# Patient Record
Sex: Male | Born: 1955 | Race: White | Smoking: Never smoker
Health system: Southern US, Community
[De-identification: ages and names within clinical notes are randomized; demographics above are authoritative.]

## PROBLEM LIST (undated history)

## (undated) DIAGNOSIS — K219 Gastro-esophageal reflux disease without esophagitis: Secondary | ICD-10-CM

## (undated) DIAGNOSIS — J45909 Unspecified asthma, uncomplicated: Secondary | ICD-10-CM

## (undated) DIAGNOSIS — T7840XA Allergy, unspecified, initial encounter: Secondary | ICD-10-CM

## (undated) DIAGNOSIS — M25519 Pain in unspecified shoulder: Secondary | ICD-10-CM

## (undated) DIAGNOSIS — M549 Dorsalgia, unspecified: Secondary | ICD-10-CM

## (undated) DIAGNOSIS — J449 Chronic obstructive pulmonary disease, unspecified: Secondary | ICD-10-CM

## (undated) DIAGNOSIS — IMO0002 Reserved for concepts with insufficient information to code with codable children: Secondary | ICD-10-CM

## (undated) DIAGNOSIS — F419 Anxiety disorder, unspecified: Secondary | ICD-10-CM

## (undated) HISTORY — DX: Pain in unspecified shoulder: M25.519

## (undated) HISTORY — DX: Gastro-esophageal reflux disease without esophagitis: K21.9

## (undated) HISTORY — DX: Chronic obstructive pulmonary disease, unspecified: J44.9

## (undated) HISTORY — DX: Allergy, unspecified, initial encounter: T78.40XA

## (undated) HISTORY — DX: Reserved for concepts with insufficient information to code with codable children: IMO0002

## (undated) HISTORY — DX: Unspecified asthma, uncomplicated: J45.909

## (undated) HISTORY — DX: Anxiety disorder, unspecified: F41.9

## (undated) HISTORY — PX: EYE SURGERY: SHX253

## (undated) HISTORY — PX: TONSILECTOMY/ADENOIDECTOMY WITH MYRINGOTOMY: SHX6125

## (undated) HISTORY — DX: Dorsalgia, unspecified: M54.9

---

## 2019-11-10 ENCOUNTER — Ambulatory Visit: Payer: Self-pay | Admitting: Family Medicine

## 2019-12-04 ENCOUNTER — Ambulatory Visit
Admission: RE | Admit: 2019-12-04 | Discharge: 2019-12-04 | Disposition: A | Discharge status: Home / Self Care | Payer: BC Managed Care – PPO | Source: Ambulatory Visit | Attending: Occupational Medicine | Admitting: Occupational Medicine

## 2019-12-04 ENCOUNTER — Other Ambulatory Visit: Payer: Self-pay | Admitting: Occupational Medicine

## 2019-12-04 ENCOUNTER — Other Ambulatory Visit: Payer: Self-pay

## 2019-12-04 DIAGNOSIS — M47816 Spondylosis without myelopathy or radiculopathy, lumbar region: Secondary | ICD-10-CM

## 2019-12-04 DIAGNOSIS — M25512 Pain in left shoulder: Secondary | ICD-10-CM | POA: Diagnosis not present

## 2019-12-04 DIAGNOSIS — M542 Cervicalgia: Secondary | ICD-10-CM

## 2019-12-04 DIAGNOSIS — M5136 Other intervertebral disc degeneration, lumbar region: Secondary | ICD-10-CM | POA: Diagnosis not present

## 2019-12-04 DIAGNOSIS — M19012 Primary osteoarthritis, left shoulder: Secondary | ICD-10-CM | POA: Diagnosis not present

## 2019-12-04 DIAGNOSIS — M47812 Spondylosis without myelopathy or radiculopathy, cervical region: Secondary | ICD-10-CM

## 2019-12-08 ENCOUNTER — Ambulatory Visit: Payer: BC Managed Care – PPO | Attending: Internal Medicine

## 2019-12-08 ENCOUNTER — Other Ambulatory Visit: Payer: Self-pay

## 2019-12-08 DIAGNOSIS — Z20822 Contact with and (suspected) exposure to covid-19: Secondary | ICD-10-CM | POA: Diagnosis not present

## 2019-12-09 LAB — NOVEL CORONAVIRUS, NAA: SARS-CoV-2, NAA: NOT DETECTED

## 2020-02-10 ENCOUNTER — Ambulatory Visit (INDEPENDENT_AMBULATORY_CARE_PROVIDER_SITE_OTHER): Payer: BC Managed Care – PPO | Admitting: Internal Medicine

## 2020-02-10 ENCOUNTER — Encounter (INDEPENDENT_AMBULATORY_CARE_PROVIDER_SITE_OTHER): Payer: Self-pay | Admitting: Internal Medicine

## 2020-02-10 ENCOUNTER — Other Ambulatory Visit: Payer: Self-pay

## 2020-02-10 VITALS — BP 140/100 | HR 87 | Temp 98.3°F | Ht 67.0 in | Wt 181.4 lb

## 2020-02-10 DIAGNOSIS — Z125 Encounter for screening for malignant neoplasm of prostate: Secondary | ICD-10-CM | POA: Diagnosis not present

## 2020-02-10 DIAGNOSIS — R5381 Other malaise: Secondary | ICD-10-CM | POA: Diagnosis not present

## 2020-02-10 DIAGNOSIS — I10 Essential (primary) hypertension: Secondary | ICD-10-CM | POA: Diagnosis not present

## 2020-02-10 DIAGNOSIS — Z131 Encounter for screening for diabetes mellitus: Secondary | ICD-10-CM

## 2020-02-10 DIAGNOSIS — Z13220 Encounter for screening for lipoid disorders: Secondary | ICD-10-CM | POA: Diagnosis not present

## 2020-02-10 DIAGNOSIS — E559 Vitamin D deficiency, unspecified: Secondary | ICD-10-CM

## 2020-02-10 DIAGNOSIS — F419 Anxiety disorder, unspecified: Secondary | ICD-10-CM

## 2020-02-10 DIAGNOSIS — R5383 Other fatigue: Secondary | ICD-10-CM | POA: Diagnosis not present

## 2020-02-10 MED ORDER — AMLODIPINE BESYLATE 5 MG PO TABS: 5.0000 mg | ORAL_TABLET | Freq: Every day | ORAL | 3 refills | Status: DC

## 2020-02-10 NOTE — Progress Notes (Signed)
Metrics: Intervention Frequency ACO  Documented Smoking Status Yearly  Screened one or more times in 24 months  Cessation Counseling or  Active cessation medication Past 24 months  Past 24 months   Guideline developer: UpToDate (See UpToDate for funding source) Date Released: 2014       Wellness Office Visit  Subjective:  Patient ID: Martin Rodriguez, male    DOB: May 07, 1956  Age: 64 y.o. MRN: [id number]  CC: This 64 year old man comes to our practice as a new patient to [place] establish care. HPI  He has a history of hypertension.  He takes lisinopril for his hypertension has noticed that his blood pressure readings have been elevated at home.  He moved from [place] in October 2020 and lives locally.  He denies any chest pain, dyspnea, palpitations or limb weakness.  He has no history of coronary artery disease or cerebrovascular disease. He has a history of anxiety and takes citalopram which he would like to discontinue if possible. Although he has never been a smoker, he apparently has been diagnosed with COPD. He retired as a Therapist, occupational from [employer]. Past Medical History:  Diagnosis Date  . Allergy    seasonal  . Anxiety   . Back pain   . COPD (chronic obstructive pulmonary disease) ([place])   . Shoulder pain       Family History  Problem Relation Age of Onset  . Memory loss Mother   . 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   Social History   Tobacco Use  . Smoking status: Never Smoker  . Smokeless tobacco: Never Used  Substance Use Topics  . Alcohol use: Yes    Alcohol/week: 2.0 standard drinks    Types: 1 Glasses of wine, 1 Cans of beer per week    Comment: occasional    Current Meds  Medication Sig  . aspirin-acetaminophen-caffeine (EXCEDRIN MIGRAINE) 250-250-65 MG tablet Take 2 tablets by mouth every 6  (six) hours as needed for headache.  . budesonide-formoterol (SYMBICORT) 160-4.5 MCG/ACT inhaler Inhale 2 puffs into the lungs 2 (two) times daily.  . citalopram (CELEXA) 10 MG tablet Take 5 mg by mouth daily.  Marland Kitchen lisinopril (ZESTRIL) 20 MG tablet Take 20 mg by mouth daily.  Marland Kitchen loratadine (CLARITIN) 10 MG tablet Take 10 mg by mouth daily.       Objective:   Today's Vitals: BP (!) 140/100 (BP Location: Left Arm, Patient Position: Sitting, Cuff Size: Normal)   Pulse 87   Temp 98.3 F (36.8 C) (Temporal)   Ht 5\' 7"  (1.702 m)   Wt 181 lb 6.4 oz (82.3 kg)   SpO2 96%   BMI 28.41 kg/m  Vitals with BMI 02/10/2020  Height 5\' 7"   Weight 181 lbs 6 oz  BMI 93.8  Systolic 101  Diastolic 751  Pulse 87     Physical Exam  He looks systemically well.  Blood pressure is not well controlled.  He is alert and orientated without any focal neurological signs.  He is overweight.     Assessment   1. Essential hypertension, benign   2. Anxiety   3. Malaise and fatigue   4. Vitamin D deficiency disease   5. Screening for diabetes mellitus   6. Screening for lipoid disorders   7. Special screening for malignant neoplasm of prostate       Tests ordered Orders  Placed This Encounter  Procedures  . CBC  . COMPLETE METABOLIC PANEL WITH GFR  . Hemoglobin A1c  . Lipid panel  . PSA  . T3, free  . TSH  . VITAMIN D 25 Hydroxy (Vit-D Deficiency, Fractures)     Plan: 1. Blood work is ordered above. 2. He will continue with antihypertensive therapy but I am going to add amlodipine 5 mg daily to the lisinopril. 3. He will continue with citalopram for the time being but our eventual goal will be to discontinue it in the future. 4. Further recommendations will depend on blood results and I will review all these results with him when I see him in about a month's time for follow-up.   Meds ordered this encounter  Medications  . amLODipine (NORVASC) 5 MG tablet    Sig: Take 1 tablet (5 mg  total) by mouth daily.    Dispense:  30 tablet    Refill:  3    Jalin Alicea Normajean Glasgow, MD

## 2020-02-11 LAB — COMPLETE METABOLIC PANEL WITH GFR
AG Ratio: 1.8 (calc) (ref 1.0–2.5)
ALT: 20 U/L (ref 9–46)
AST: 18 U/L (ref 10–35)
Albumin: 4.4 g/dL (ref 3.6–5.1)
Alkaline phosphatase (APISO): 63 U/L (ref 35–144)
BUN: 18 mg/dL (ref 7–25)
CO2: 28 mmol/L (ref 20–32)
Calcium: 9.2 mg/dL (ref 8.6–10.3)
Chloride: 105 mmol/L (ref 98–110)
Creat: 1 mg/dL (ref 0.70–1.25)
GFR, Est African American: 93 mL/min/{1.73_m2} (ref >=60)
GFR, Est Non African American: 80 mL/min/{1.73_m2} (ref >=60)
Globulin: 2.4 g/dL (calc) (ref 1.9–3.7)
Glucose, Bld: 85 mg/dL (ref 65–99)
Potassium: 4.5 mmol/L (ref 3.5–5.3)
Sodium: 144 mmol/L (ref 135–146)
Total Bilirubin: 0.4 mg/dL (ref 0.2–1.2)
Total Protein: 6.8 g/dL (ref 6.1–8.1)

## 2020-02-11 LAB — CBC
HCT: 43.6 % (ref 38.5–50.0)
Hemoglobin: 14.7 g/dL (ref 13.2–17.1)
MCH: 30.3 pg (ref 27.0–33.0)
MCHC: 33.7 g/dL (ref 32.0–36.0)
MCV: 89.9 fL (ref 80.0–100.0)
MPV: 9.4 fL (ref 7.5–12.5)
Platelets: 246 10*3/uL (ref 140–400)
RBC: 4.85 10*6/uL (ref 4.20–5.80)
RDW: 13.1 % (ref 11.0–15.0)
WBC: 6.5 10*3/uL (ref 3.8–10.8)

## 2020-02-11 LAB — T3, FREE: T3, Free: 3.6 pg/mL (ref 2.3–4.2)

## 2020-02-11 LAB — LIPID PANEL
Cholesterol: 203 mg/dL — ABNORMAL HIGH (ref <200)
HDL: 43 mg/dL (ref >=40)
LDL Cholesterol (Calc): 137 mg/dL (calc) — ABNORMAL HIGH
Non-HDL Cholesterol (Calc): 160 mg/dL (calc) — ABNORMAL HIGH (ref <130)
Total CHOL/HDL Ratio: 4.7 (calc) (ref <5.0)
Triglycerides: 117 mg/dL (ref <150)

## 2020-02-11 LAB — HEMOGLOBIN A1C
Hgb A1c MFr Bld: 5.7 % of total Hgb — ABNORMAL HIGH (ref <5.7)
Mean Plasma Glucose: 117 (calc)
eAG (mmol/L): 6.5 (calc)

## 2020-02-11 LAB — PSA: PSA: 0.8 ng/mL (ref <=4.0)

## 2020-02-11 LAB — TSH: TSH: 3.57 mIU/L (ref 0.40–4.50)

## 2020-02-11 LAB — VITAMIN D 25 HYDROXY (VIT D DEFICIENCY, FRACTURES): Vit D, 25-Hydroxy: 11 ng/mL — ABNORMAL LOW (ref 30–100)

## 2020-03-10 ENCOUNTER — Ambulatory Visit: Payer: Self-pay | Admitting: Family Medicine

## 2020-03-17 ENCOUNTER — Encounter (INDEPENDENT_AMBULATORY_CARE_PROVIDER_SITE_OTHER): Payer: Self-pay | Admitting: Internal Medicine

## 2020-03-17 ENCOUNTER — Other Ambulatory Visit: Payer: Self-pay

## 2020-03-17 ENCOUNTER — Ambulatory Visit (INDEPENDENT_AMBULATORY_CARE_PROVIDER_SITE_OTHER): Payer: BC Managed Care – PPO | Admitting: Internal Medicine

## 2020-03-17 VITALS — BP 150/90 | HR 89 | Temp 97.1°F | Resp 18 | Ht 67.0 in | Wt 182.2 lb

## 2020-03-17 DIAGNOSIS — E782 Mixed hyperlipidemia: Secondary | ICD-10-CM

## 2020-03-17 DIAGNOSIS — E559 Vitamin D deficiency, unspecified: Secondary | ICD-10-CM

## 2020-03-17 DIAGNOSIS — R7303 Prediabetes: Secondary | ICD-10-CM

## 2020-03-17 DIAGNOSIS — F419 Anxiety disorder, unspecified: Secondary | ICD-10-CM | POA: Diagnosis not present

## 2020-03-17 DIAGNOSIS — I10 Essential (primary) hypertension: Secondary | ICD-10-CM

## 2020-03-17 DIAGNOSIS — E785 Hyperlipidemia, unspecified: Secondary | ICD-10-CM

## 2020-03-17 HISTORY — DX: Essential (primary) hypertension: I10

## 2020-03-17 HISTORY — DX: Hyperlipidemia, unspecified: E78.5

## 2020-03-17 HISTORY — DX: Prediabetes: R73.03

## 2020-03-17 NOTE — Progress Notes (Signed)
Metrics: Intervention Frequency ACO  Documented Smoking Status Yearly  Screened one or more times in 24 months  Cessation Counseling or  Active cessation medication Past 24 months  Past 24 months   Guideline developer: UpToDate (See UpToDate for funding source) Date Released: 2014       Wellness Office Visit  Subjective:  Patient ID: Martin Rodriguez, male    DOB: 1956/04/21  Age: 64 y.o. MRN: [id number]  CC: This man comes in for follow-up of his hypertension and to discuss all his blood work results. HPI He has significant vitamin D deficiency and he does describe immense fatigue on a daily basis. He is also prediabetic with a hemoglobin A1c of 5.7%. He has mild hyperlipidemia with elevated total cholesterol and LDL cholesterol.  Past Medical History:  Diagnosis Date  . Allergy    seasonal  . Anxiety   . Back pain   . COPD (chronic obstructive pulmonary disease) ([place])   . Essential hypertension, benign 03/17/2020  . HLD (hyperlipidemia) 03/17/2020  . Prediabetes 03/17/2020  . Shoulder pain       Family History  Problem Relation Age of Onset  . Memory loss Mother   . 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   Social History   Tobacco Use  . Smoking status: Never Smoker  . Smokeless tobacco: Never Used  Substance Use Topics  . Alcohol use: Yes    Alcohol/week: 2.0 standard drinks    Types: 1 Glasses of wine, 1 Cans of beer per week    Comment: occasional    Current Meds  Medication Sig  . amLODipine (NORVASC) 5 MG tablet Take 1 tablet (5 mg total) by mouth daily.  Marland Kitchen aspirin-acetaminophen-caffeine (EXCEDRIN MIGRAINE) 250-250-65 MG tablet Take 2 tablets by mouth every 6 (six) hours as needed for headache.  . budesonide-formoterol (SYMBICORT) 160-4.5 MCG/ACT inhaler Inhale 2 puffs into the lungs 2 (two) times daily.  . citalopram  (CELEXA) 10 MG tablet Take 5 mg by mouth daily.  Marland Kitchen lisinopril (ZESTRIL) 20 MG tablet Take 20 mg by mouth daily.  Marland Kitchen loratadine (CLARITIN) 10 MG tablet Take 10 mg by mouth daily.     Objective:   Today's Vitals: BP (!) 150/90   Pulse 89   Temp (!) 97.1 F (36.2 C) (Temporal)   Resp 18   Ht 5\' 7"  (1.702 m)   Wt 182 lb 3.2 oz (82.6 kg)   SpO2 98%   BMI 28.54 kg/m  Vitals with BMI 03/17/2020 02/10/2020  Height 5\' 7"  5\' 7"   Weight 182 lbs 3 oz 181 lbs 6 oz  BMI 41.93 79.0  Systolic 240 973  Diastolic 90 532  Pulse 89 87     Physical Exam  He looks systemically well.  Blood pressure still uncontrolled.  He remains overweight.  Alert and orientated without any focal neurological signs.     Assessment   1. Vitamin D deficiency disease   2. Essential hypertension, benign   3. Anxiety   4. Mixed hyperlipidemia   5. Prediabetes       Tests ordered No orders of the defined types were placed in this encounter.    Plan: 1. As far as his uncontrolled hypertension is concerned, I told him to increase the dose of amlodipine so that he is taking 10 mg every day.  He will continue with lisinopril dose as before. 2.  I recommended he start taking vitamin D3 10,000 units daily. 3. As far as his prediabetes is concerned and overweight state, we discussed nutrition.  I introduced the concept of intermittent fasting combined with a healthier diet.  I have given him a diet sheet regarding this. 4. I will see him in about a month's time for close monitoring to see how he is doing and hopefully his blood pressure has improved and he has lost some weight.  We will check vitamin D levels also providing is taking vitamin D3 as recommended.   No orders of the defined types were placed in this encounter.   Wilson Singer, MD

## 2020-03-17 NOTE — Patient Instructions (Addendum)
1.VITAMIN D3 10,000 UNITS/DAY  2.INCREASE AMLODOPINE TO TWO TABLETS OF 5mg ,FOR TOTAL DOSE OF 10mg  DAILY.   [facility] Dietary Recommendations for Weight Loss What to Avoid . Avoid added sugars o Often added sugar can be found in processed foods such as many condiments, dry cereals, cakes, cookies, chips, crisps, crackers, candies, sweetened drinks, etc.  o Read labels and AVOID/DECREASE use of foods with the following in their ingredient list: Sugar, fructose, high fructose corn syrup, sucrose, glucose, maltose, dextrose, molasses, cane sugar, brown sugar, any type of syrup, agave nectar, etc.   . Avoid snacking in between meals . Avoid foods made with flour o If you are going to eat food made with flour, choose those made with whole-grains; and, minimize your consumption as much as is tolerable . Avoid processed foods o These foods are generally stocked in the middle of the grocery store. Focus on shopping on the perimeter of the grocery.  . Avoid Meat  o We recommend following a plant-based diet at [facility]. Thus, we recommend avoiding meat as a general rule. Consider eating beans, legumes, eggs, and/or dairy products for regular protein sources o If you plan on eating meat limit to 4 ounces of meat at a time and choose lean options such as Fish, chicken, [place]. Avoid red meat intake such as pork and/or steak What to Include . Vegetables o GREEN LEAFY VEGETABLES: Kale, spinach, mustard greens, collard greens, cabbage, broccoli, etc. o OTHER: Asparagus, cauliflower, eggplant, carrots, peas, Brussel sprouts, tomatoes, bell peppers, zucchini, beets, cucumbers, etc. . Grains, seeds, and legumes o Beans: kidney beans, black eyed peas, garbanzo beans, black beans, pinto beans, etc. o Whole, unrefined grains: brown rice, barley, bulgur, oatmeal, etc. . Healthy fats  o Avoid highly processed fats such as vegetable oil o Examples of healthy fats: avocado, olives, virgin  olive oil, dark chocolate (?72% Cocoa), nuts (peanuts, almonds, walnuts, cashews, pecans, etc.) . None to Low Intake of Animal Sources of Protein o Meat sources: chicken, [place], salmon, tuna. Limit to 4 ounces of meat at one time. o Consider limiting dairy sources, but when choosing dairy focus on: PLAIN [place] yogurt, cottage cheese, high-protein milk . Fruit o Choose berries  When to Eat . Intermittent Fasting: o Choosing not to eat for a specific time period, but DO FOCUS ON HYDRATION when fasting o Multiple Techniques: - Time Restricted Eating: eat 3 meals in a day, each meal lasting no more than 60 minutes, no snacks between meals - 16-18 hour fast: fast for 16 to 18 hours up to 7 days a week. Often suggested to start with 2-3 nonconsecutive days per week.  . Remember the time you sleep is counted as fasting.  . Examples of eating schedule: Fast from 7:00pm-11:00am. Eat between 11:00am-7:00pm.  - 24-hour fast: fast for 24 hours up to every other day. Often suggested to start with 1 day per week . Remember the time you sleep is counted as fasting . Examples of eating schedule:  o Eating day: eat 2-3 meals on your eating day. If doing 2 meals, each meal should last no more than 90 minutes. If doing 3 meals, each meal should last no more than 60 minutes. Finish last meal by 7:00pm. o Fasting day: Fast until 7:00pm.  o IF YOU FEEL UNWELL FOR ANY REASON/IN ANY WAY WHEN FASTING, STOP FASTING BY EATING A NUTRITIOUS SNACK OR LIGHT MEAL o ALWAYS FOCUS ON HYDRATION DURING FASTS - Acceptable Hydration sources: water, broths, tea/coffee (  black tea/coffee is best but using a small amount of whole-fat dairy products in coffee/tea is acceptable).  - Poor Hydration Sources: anything with sugar or artificial sweeteners added to it  These recommendations have been developed for patients that are actively receiving medical care from either Dr. Karilyn Cota or Jiles Prows, DNP, NP-C at [facility].  These recommendations are developed for patients with specific medical conditions and are not meant to be distributed or used by others that are not actively receiving care from either provider listed above at [facility]. It is not appropriate to participate in the above eating plans without proper medical supervision.   Reference: Lawrence Santiago. The obesity code. [place]; 2016.

## 2020-04-06 ENCOUNTER — Other Ambulatory Visit (INDEPENDENT_AMBULATORY_CARE_PROVIDER_SITE_OTHER): Payer: Self-pay | Admitting: Internal Medicine

## 2020-04-21 ENCOUNTER — Encounter (INDEPENDENT_AMBULATORY_CARE_PROVIDER_SITE_OTHER): Payer: Self-pay | Admitting: Internal Medicine

## 2020-04-21 ENCOUNTER — Ambulatory Visit (INDEPENDENT_AMBULATORY_CARE_PROVIDER_SITE_OTHER): Payer: BC Managed Care – PPO | Admitting: Internal Medicine

## 2020-04-21 ENCOUNTER — Other Ambulatory Visit: Payer: Self-pay

## 2020-04-21 VITALS — BP 140/80 | HR 95 | Temp 97.5°F | Resp 18 | Ht 67.0 in | Wt 183.0 lb

## 2020-04-21 DIAGNOSIS — R5381 Other malaise: Secondary | ICD-10-CM

## 2020-04-21 DIAGNOSIS — E559 Vitamin D deficiency, unspecified: Secondary | ICD-10-CM | POA: Diagnosis not present

## 2020-04-21 DIAGNOSIS — R300 Dysuria: Secondary | ICD-10-CM

## 2020-04-21 DIAGNOSIS — E782 Mixed hyperlipidemia: Secondary | ICD-10-CM | POA: Diagnosis not present

## 2020-04-21 DIAGNOSIS — I10 Essential (primary) hypertension: Secondary | ICD-10-CM | POA: Diagnosis not present

## 2020-04-21 DIAGNOSIS — R7303 Prediabetes: Secondary | ICD-10-CM | POA: Diagnosis not present

## 2020-04-21 DIAGNOSIS — R5383 Other fatigue: Secondary | ICD-10-CM

## 2020-04-21 NOTE — Progress Notes (Signed)
Metrics: Intervention Frequency ACO  Documented Smoking Status Yearly  Screened one or more times in 24 months  Cessation Counseling or  Active cessation medication Past 24 months  Past 24 months   Guideline developer: UpToDate (See UpToDate for funding source) Date Released: 2014       Wellness Office Visit  Subjective:  Patient ID: Martin Rodriguez, male    DOB: 1955/11/08  Age: 64 y.o. MRN: [id number]  CC: This man comes in for follow-up of hypertension, prediabetes, hyperlipidemia and overweight state. HPI  He is tolerating his antihypertensive medications and blood pressure has reasonably improved. He was unable to do intermittent fasting consistently.  He has not lost weight.  He tried to wean himself off citalopram and has done so.  I think he feels a little bit less energetic since he weaned himself off the citalopram. He cannot seem to get motivated to exercise.  He used to bench press 350 pounds and he is able to do 70 push-ups without stopping.  Walking outside seems to be a struggle. Also for the last few days, he describes dysuria without hematuria.  He has taken Azo over-the-counter. Past Medical History:  Diagnosis Date  . Allergy    seasonal  . Anxiety   . Back pain   . COPD (chronic obstructive pulmonary disease) ([place])   . Essential hypertension, benign 03/17/2020  . HLD (hyperlipidemia) 03/17/2020  . Prediabetes 03/17/2020  . Shoulder pain    Past Surgical History:  Procedure Laterality Date  . EYE SURGERY    . TONSILECTOMY/ADENOIDECTOMY WITH MYRINGOTOMY       Family History  Problem Relation Age of Onset  . Memory loss Mother   . 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   Social History   Tobacco Use  . Smoking status: Never Smoker  . Smokeless tobacco: Never Used  Substance Use Topics  . Alcohol use: Yes     Alcohol/week: 2.0 standard drinks    Types: 1 Glasses of wine, 1 Cans of beer per week    Comment: occasional    Current Meds  Medication Sig  . amLODipine (NORVASC) 5 MG tablet TAKE 1 TABLET BY MOUTH EVERY DAY  . aspirin-acetaminophen-caffeine (EXCEDRIN MIGRAINE) 250-250-65 MG tablet Take 2 tablets by mouth every 6 (six) hours as needed for headache.  . budesonide-formoterol (SYMBICORT) 160-4.5 MCG/ACT inhaler Inhale 2 puffs into the lungs 2 (two) times daily.  . Cholecalciferol (VITAMIN D-3) 125 MCG (5000 UT) TABS Take 2 tablets by mouth daily.  Marland Kitchen lisinopril (ZESTRIL) 20 MG tablet TAKE 1 TABLET BY MOUTH EVERY DAY  . loratadine (CLARITIN) 10 MG tablet Take 10 mg by mouth daily.       Depression screen [facility] 2/9 02/10/2020  Decreased Interest 0  Down, Depressed, Hopeless 0  PHQ - 2 Score 0     Objective:   Today's Vitals: BP 140/80 (BP Location: Left Arm, Patient Position: Sitting, Cuff Size: Normal)   Pulse 95   Temp (!) 97.5 F (36.4 C) (Temporal)   Resp 18   Ht 5\' 7"  (1.702 m)   Wt 183 lb (83 kg)   SpO2 97%   BMI 28.66 kg/m  Vitals with BMI 04/21/2020 03/17/2020 02/10/2020  Height 5\' 7"  5\' 7"  5\' 7"   Weight 183 lbs 182 lbs 3 oz 181 lbs 6 oz  BMI 28.66 19.37 90.2  Systolic 409 735 329  Diastolic 80 90 100  Pulse 95 89 87     Physical Exam  His weight is very stable.  Blood pressure has improved since last visit.  Alert and orientated without any focal neurological signs.     Assessment   1. Essential hypertension, benign   2. Mixed hyperlipidemia   3. Prediabetes   4. Vitamin D deficiency disease   5. Malaise and fatigue   6. Dysuria       Tests ordered Orders Placed This Encounter  Procedures  . Cardio IQ Adv Lipid and Inflamm Pnl  . Cardio IQ Insulin Resistance Panel with Score  . COMPLETE METABOLIC PANEL WITH GFR  . VITAMIN D 25 Hydroxy (Vit-D Deficiency, Fractures)  . Urinalysis w microscopic + reflex cultur     Plan: 1. Urine will be sent for  urinalysis and possible culture. 2. I will continue with the amlodipine and lisinopril for hypertension which seems to be controlling his blood pressure well. 3. We will check cardio IQ insulin resistance panel and lipid panel with inflammatory markers for further risk stratification. 4. He has been taking vitamin D3 10,000 units daily and does feel that it seems to be helping him. 5. Further recommendations will depend on all his results and I will see him in 3 weeks time for follow-up   No orders of the defined types were placed in this encounter.   Wilson Singer, MD

## 2020-04-21 NOTE — Patient Instructions (Signed)
[facility] Dietary Recommendations for Weight Loss What to Avoid . Avoid added sugars o Often added sugar can be found in processed foods such as many condiments, dry cereals, cakes, cookies, chips, crisps, crackers, candies, sweetened drinks, etc.  o Read labels and AVOID/DECREASE use of foods with the following in their ingredient list: Sugar, fructose, high fructose corn syrup, sucrose, glucose, maltose, dextrose, molasses, cane sugar, brown sugar, any type of syrup, agave nectar, etc.   . Avoid snacking in between meals . Avoid foods made with flour o If you are going to eat food made with flour, choose those made with whole-grains; and, minimize your consumption as much as is tolerable . Avoid processed foods o These foods are generally stocked in the middle of the grocery store. Focus on shopping on the perimeter of the grocery.  . Avoid Meat  o We recommend following a plant-based diet at [facility]. Thus, we recommend avoiding meat as a general rule. Consider eating beans, legumes, eggs, and/or dairy products for regular protein sources o If you plan on eating meat limit to 4 ounces of meat at a time and choose lean options such as Fish, chicken, turkey. Avoid red meat intake such as pork and/or steak What to Include . Vegetables o GREEN LEAFY VEGETABLES: Kale, spinach, mustard greens, collard greens, cabbage, broccoli, etc. o OTHER: Asparagus, cauliflower, eggplant, carrots, peas, Brussel sprouts, tomatoes, bell peppers, zucchini, beets, cucumbers, etc. . Grains, seeds, and legumes o Beans: kidney beans, black eyed peas, garbanzo beans, black beans, pinto beans, etc. o Whole, unrefined grains: brown rice, barley, bulgur, oatmeal, etc. . Healthy fats  o Avoid highly processed fats such as vegetable oil o Examples of healthy fats: avocado, olives, virgin olive oil, dark chocolate (?72% Cocoa), nuts (peanuts, almonds, walnuts, cashews, pecans, etc.) . None to Low  Intake of Animal Sources of Protein o Meat sources: chicken, turkey, salmon, tuna. Limit to 4 ounces of meat at one time. o Consider limiting dairy sources, but when choosing dairy focus on: PLAIN [place] yogurt, cottage cheese, high-protein milk . Fruit o Choose berries  When to Eat . Intermittent Fasting: o Choosing not to eat for a specific time period, but DO FOCUS ON HYDRATION when fasting o Multiple Techniques: - Time Restricted Eating: eat 3 meals in a day, each meal lasting no more than 60 minutes, no snacks between meals - 16-18 hour fast: fast for 16 to 18 hours up to 7 days a week. Often suggested to start with 2-3 nonconsecutive days per week.  . Remember the time you sleep is counted as fasting.  . Examples of eating schedule: Fast from 7:00pm-11:00am. Eat between 11:00am-7:00pm.  - 24-hour fast: fast for 24 hours up to every other day. Often suggested to start with 1 day per week . Remember the time you sleep is counted as fasting . Examples of eating schedule:  o Eating day: eat 2-3 meals on your eating day. If doing 2 meals, each meal should last no more than 90 minutes. If doing 3 meals, each meal should last no more than 60 minutes. Finish last meal by 7:00pm. o Fasting day: Fast until 7:00pm.  o IF YOU FEEL UNWELL FOR ANY REASON/IN ANY WAY WHEN FASTING, STOP FASTING BY EATING A NUTRITIOUS SNACK OR LIGHT MEAL o ALWAYS FOCUS ON HYDRATION DURING FASTS - Acceptable Hydration sources: water, broths, tea/coffee (black tea/coffee is best but using a small amount of whole-fat dairy products in coffee/tea is acceptable).  -   Poor Hydration Sources: anything with sugar or artificial sweeteners added to it  These recommendations have been developed for patients that are actively receiving medical care from either Dr. Andriea Hasegawa or Sarah Gray, DNP, NP-C at [facility]. These recommendations are developed for patients with specific medical conditions and are not meant to be  distributed or used by others that are not actively receiving care from either provider listed above at [facility]. It is not appropriate to participate in the above eating plans without proper medical supervision.   Reference: Fung, J. The obesity code. [place]; 2016.   

## 2020-04-26 LAB — CARDIO IQ ADV LIPID AND INFLAMM PNL
Apolipoprotein B: 135 mg/dL — ABNORMAL HIGH (ref <90)
Cholesterol: 245 mg/dL — ABNORMAL HIGH (ref <200)
HDL: 48 mg/dL (ref >39)
LDL Cholesterol (Calc): 161 mg/dL (calc) — ABNORMAL HIGH (ref <100)
LDL Large: 6359 nmol/L — ABNORMAL LOW (ref >6729)
LDL Medium: 544 nmol/L — ABNORMAL HIGH (ref <215)
LDL Particle Number: 2660 nmol/L — ABNORMAL HIGH (ref <1138)
LDL Peak Size: 212.7 Angstrom — ABNORMAL LOW (ref >222.9)
LDL Small: 624 nmol/L — ABNORMAL HIGH (ref <142)
Lipoprotein (a): 41 nmol/L (ref <75)
Non-HDL Cholesterol (Calc): 197 mg/dL (calc) — ABNORMAL HIGH (ref <130)
PLAC: 124 nmol/min/mL — ABNORMAL HIGH (ref <124)
Total CHOL/HDL Ratio: 5.1 calc — ABNORMAL HIGH (ref <3.6)
Triglycerides: 203 mg/dL — ABNORMAL HIGH (ref <150)
hs-CRP: 4.5 mg/L — ABNORMAL HIGH (ref <1.0)

## 2020-04-26 LAB — URINALYSIS W MICROSCOPIC + REFLEX CULTURE
Bacteria, UA: NONE SEEN /HPF
Bilirubin Urine: NEGATIVE
Glucose, UA: NEGATIVE
Hgb urine dipstick: NEGATIVE
Hyaline Cast: NONE SEEN /LPF
Ketones, ur: NEGATIVE
Leukocyte Esterase: NEGATIVE
Nitrites, Initial: NEGATIVE
Protein, ur: NEGATIVE
Specific Gravity, Urine: 1.017 (ref 1.001–1.03)
Squamous Epithelial / HPF: NONE SEEN /HPF (ref <=5)
WBC, UA: NONE SEEN /HPF (ref 0–5)
pH: 5.5 (ref 5.0–8.0)

## 2020-04-26 LAB — COMPLETE METABOLIC PANEL WITH GFR
AG Ratio: 2 (calc) (ref 1.0–2.5)
ALT: 25 U/L (ref 9–46)
AST: 21 U/L (ref 10–35)
Albumin: 4.9 g/dL (ref 3.6–5.1)
Alkaline phosphatase (APISO): 68 U/L (ref 35–144)
BUN: 17 mg/dL (ref 7–25)
CO2: 28 mmol/L (ref 20–32)
Calcium: 9.3 mg/dL (ref 8.6–10.3)
Chloride: 103 mmol/L (ref 98–110)
Creat: 0.94 mg/dL (ref 0.70–1.25)
GFR, Est African American: 100 mL/min/{1.73_m2} (ref >=60)
GFR, Est Non African American: 87 mL/min/{1.73_m2} (ref >=60)
Globulin: 2.4 g/dL (calc) (ref 1.9–3.7)
Glucose, Bld: 102 mg/dL — ABNORMAL HIGH (ref 65–99)
Potassium: 3.8 mmol/L (ref 3.5–5.3)
Sodium: 141 mmol/L (ref 135–146)
Total Bilirubin: 0.5 mg/dL (ref 0.2–1.2)
Total Protein: 7.3 g/dL (ref 6.1–8.1)

## 2020-04-26 LAB — CARDIO IQ INSULIN RESISTANCE PANEL WITH SCORE
C-PEPTIDE, LC/MS/MS: 4.77 ng/mL — ABNORMAL HIGH (ref 0.68–2.16)
INSULIN, INTACT, LC/MS/MS: 44 u[IU]/mL — ABNORMAL HIGH (ref <=16)
Insulin Resistance Score: 100 — ABNORMAL HIGH (ref <=66)

## 2020-04-26 LAB — NO CULTURE INDICATED

## 2020-04-26 LAB — VITAMIN D 25 HYDROXY (VIT D DEFICIENCY, FRACTURES): Vit D, 25-Hydroxy: 51 ng/mL (ref 30–100)

## 2020-05-06 ENCOUNTER — Encounter (INDEPENDENT_AMBULATORY_CARE_PROVIDER_SITE_OTHER): Payer: Self-pay | Admitting: Internal Medicine

## 2020-05-11 ENCOUNTER — Other Ambulatory Visit (INDEPENDENT_AMBULATORY_CARE_PROVIDER_SITE_OTHER): Payer: Self-pay | Admitting: Internal Medicine

## 2020-05-11 MED ORDER — CITALOPRAM HYDROBROMIDE 20 MG PO TABS
20.0000 mg | ORAL_TABLET | Freq: Every day | ORAL | 3 refills | Status: DC
Start: 2020-05-11 — End: 2020-06-02

## 2020-05-19 ENCOUNTER — Other Ambulatory Visit: Payer: Self-pay

## 2020-05-19 ENCOUNTER — Encounter (INDEPENDENT_AMBULATORY_CARE_PROVIDER_SITE_OTHER): Payer: Self-pay | Admitting: Internal Medicine

## 2020-05-19 ENCOUNTER — Ambulatory Visit (INDEPENDENT_AMBULATORY_CARE_PROVIDER_SITE_OTHER): Payer: BC Managed Care – PPO | Admitting: Internal Medicine

## 2020-05-19 VITALS — BP 115/75 | HR 91 | Temp 97.7°F | Ht 67.0 in | Wt 177.0 lb

## 2020-05-19 DIAGNOSIS — E782 Mixed hyperlipidemia: Secondary | ICD-10-CM | POA: Diagnosis not present

## 2020-05-19 DIAGNOSIS — I10 Essential (primary) hypertension: Secondary | ICD-10-CM

## 2020-05-19 DIAGNOSIS — E8881 Metabolic syndrome: Secondary | ICD-10-CM

## 2020-05-19 DIAGNOSIS — F419 Anxiety disorder, unspecified: Secondary | ICD-10-CM

## 2020-05-19 DIAGNOSIS — G4700 Insomnia, unspecified: Secondary | ICD-10-CM

## 2020-05-19 NOTE — Progress Notes (Signed)
Metrics: Intervention Frequency ACO  Documented Smoking Status Yearly  Screened one or more times in 24 months  Cessation Counseling or  Active cessation medication Past 24 months  Past 24 months   Guideline developer: UpToDate (See UpToDate for funding source) Date Released: 2014       Wellness Office Visit  Subjective:  Patient ID: Martin Rodriguez, male    DOB: 05/24/1956  Age: 64 y.o. MRN: [id number]  CC: This man comes in to discuss his blood results and further recommendations regarding his hyperlipidemia, hypertension. HPI  I reviewed all his blood work with him and his lipid panel shows unfavorable LDL particle numbers.  His C-reactive protein is elevated also. His insulin resistance score is 100 which puts him at high risk for developing diabetes.  Remaining blood work is unremarkable. He had message me that he does need citalopram.  He tried to stop it but he realized that this made him more anxious and he has been taking it again and this is helped him. Past Medical History:  Diagnosis Date  . Allergy    seasonal  . Anxiety   . Back pain   . COPD (chronic obstructive pulmonary disease) (HCC)   . Essential hypertension, benign 03/17/2020  . HLD (hyperlipidemia) 03/17/2020  . Prediabetes 03/17/2020  . Shoulder pain    Past Surgical History:  Procedure Laterality Date  . EYE SURGERY    . TONSILECTOMY/ADENOIDECTOMY WITH MYRINGOTOMY       Family History  Problem Relation Age of Onset  . Memory loss Mother   . 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   Social History   Tobacco Use  . Smoking status: Never Smoker  . Smokeless tobacco: Never Used  Substance Use Topics  . Alcohol use: Yes    Alcohol/week: 2.0 standard drinks    Types: 1 Glasses of wine, 1 Cans of beer per week    Comment: occasional    Current Meds  Medication  Sig  . amLODipine (NORVASC) 5 MG tablet TAKE 1 TABLET BY MOUTH EVERY DAY (Patient taking differently: 5 mg in the morning and at bedtime. )  . aspirin-acetaminophen-caffeine (EXCEDRIN MIGRAINE) 250-250-65 MG tablet Take 2 tablets by mouth every 6 (six) hours as needed for headache.  . Cholecalciferol (VITAMIN D-3) 125 MCG (5000 UT) TABS Take 2 tablets by mouth daily.  . citalopram (CELEXA) 20 MG tablet Take 1 tablet (20 mg total) by mouth daily.  Marland Kitchen lisinopril (ZESTRIL) 20 MG tablet TAKE 1 TABLET BY MOUTH EVERY DAY  . loratadine (CLARITIN) 10 MG tablet Take 10 mg by mouth daily.      Depression screen [facility] 2/9 02/10/2020  Decreased Interest 0  Down, Depressed, Hopeless 0  PHQ - 2 Score 0     Objective:   Today's Vitals: BP 115/75 (BP Location: Left Arm, Patient Position: Sitting, Cuff Size: Normal)   Pulse 91   Temp 97.7 F (36.5 C) (Temporal)   Ht 5\' 7"  (1.702 m)   Wt 177 lb (80.3 kg)   SpO2 97%   BMI 27.72 kg/m  Vitals with BMI 05/19/2020 04/21/2020 03/17/2020  Height 5\' 7"  5\' 7"  5\' 7"   Weight 177 lbs 183 lbs 182 lbs 3 oz  BMI 27.72 28.66 28.53  Systolic 115 140 05/17/2020  Diastolic 75 80 90  Pulse 91 95 89     Physical Exam  He looks  systemically well.  He has lost 6 pounds weight since last time I saw him.  Blood pressure excellent.     Assessment   1. Mixed hyperlipidemia   2. Essential hypertension, benign   3. Anxiety   4. Insulin resistance   5. Insomnia, unspecified type       Tests ordered No orders of the defined types were placed in this encounter.    Plan: 1. I discussed his lipid panel in detail and we discussed nutrition to help his lipid panel improve as well as particle numbers and also improve in insulin resistance.  I introduced the concept of intermittent fasting combined with a plant-based diet. 2. He will continue with lisinopril for his hypertension as well as amlodipine which is keeping his blood pressure under good control. 3. He will  continue with citalopram for his anxiety. 4. Follow-up in 3 months to review.   No orders of the defined types were placed in this encounter.   Wilson Singer, MD

## 2020-05-19 NOTE — Patient Instructions (Signed)
[facility] Dietary Recommendations for Weight Loss What to Avoid . Avoid added sugars o Often added sugar can be found in processed foods such as many condiments, dry cereals, cakes, cookies, chips, crisps, crackers, candies, sweetened drinks, etc.  o Read labels and AVOID/DECREASE use of foods with the following in their ingredient list: Sugar, fructose, high fructose corn syrup, sucrose, glucose, maltose, dextrose, molasses, cane sugar, brown sugar, any type of syrup, agave nectar, etc.   . Avoid snacking in between meals . Avoid foods made with flour o If you are going to eat food made with flour, choose those made with whole-grains; and, minimize your consumption as much as is tolerable . Avoid processed foods o These foods are generally stocked in the middle of the grocery store. Focus on shopping on the perimeter of the grocery.  . Avoid Meat  o We recommend following a plant-based diet at [facility]. Thus, we recommend avoiding meat as a general rule. Consider eating beans, legumes, eggs, and/or dairy products for regular protein sources o If you plan on eating meat limit to 4 ounces of meat at a time and choose lean options such as Fish, chicken, turkey. Avoid red meat intake such as pork and/or steak What to Include . Vegetables o GREEN LEAFY VEGETABLES: Kale, spinach, mustard greens, collard greens, cabbage, broccoli, etc. o OTHER: Asparagus, cauliflower, eggplant, carrots, peas, Brussel sprouts, tomatoes, bell peppers, zucchini, beets, cucumbers, etc. . Grains, seeds, and legumes o Beans: kidney beans, black eyed peas, garbanzo beans, black beans, pinto beans, etc. o Whole, unrefined grains: brown rice, barley, bulgur, oatmeal, etc. . Healthy fats  o Avoid highly processed fats such as vegetable oil o Examples of healthy fats: avocado, olives, virgin olive oil, dark chocolate (?72% Cocoa), nuts (peanuts, almonds, walnuts, cashews, pecans, etc.) . None to Low  Intake of Animal Sources of Protein o Meat sources: chicken, turkey, salmon, tuna. Limit to 4 ounces of meat at one time. o Consider limiting dairy sources, but when choosing dairy focus on: PLAIN Greek yogurt, cottage cheese, high-protein milk . Fruit o Choose berries  When to Eat . Intermittent Fasting: o Choosing not to eat for a specific time period, but DO FOCUS ON HYDRATION when fasting o Multiple Techniques: - Time Restricted Eating: eat 3 meals in a day, each meal lasting no more than 60 minutes, no snacks between meals - 16-18 hour fast: fast for 16 to 18 hours up to 7 days a week. Often suggested to start with 2-3 nonconsecutive days per week.  . Remember the time you sleep is counted as fasting.  . Examples of eating schedule: Fast from 7:00pm-11:00am. Eat between 11:00am-7:00pm.  - 24-hour fast: fast for 24 hours up to every other day. Often suggested to start with 1 day per week . Remember the time you sleep is counted as fasting . Examples of eating schedule:  o Eating day: eat 2-3 meals on your eating day. If doing 2 meals, each meal should last no more than 90 minutes. If doing 3 meals, each meal should last no more than 60 minutes. Finish last meal by 7:00pm. o Fasting day: Fast until 7:00pm.  o IF YOU FEEL UNWELL FOR ANY REASON/IN ANY WAY WHEN FASTING, STOP FASTING BY EATING A NUTRITIOUS SNACK OR LIGHT MEAL o ALWAYS FOCUS ON HYDRATION DURING FASTS - Acceptable Hydration sources: water, broths, tea/coffee (black tea/coffee is best but using a small amount of whole-fat dairy products in coffee/tea is acceptable).  -   Poor Hydration Sources: anything with sugar or artificial sweeteners added to it  These recommendations have been developed for patients that are actively receiving medical care from either Dr. Olliver Boyadjian or Sarah Gray, DNP, NP-C at [facility]. These recommendations are developed for patients with specific medical conditions and are not meant to be  distributed or used by others that are not actively receiving care from either provider listed above at [facility]. It is not appropriate to participate in the above eating plans without proper medical supervision.   Reference: Fung, J. The obesity code. [place]: [place]; 2016.   

## 2020-05-21 DIAGNOSIS — Z20828 Contact with and (suspected) exposure to other viral communicable diseases: Secondary | ICD-10-CM | POA: Diagnosis not present

## 2020-05-26 ENCOUNTER — Other Ambulatory Visit (INDEPENDENT_AMBULATORY_CARE_PROVIDER_SITE_OTHER): Payer: Self-pay | Admitting: Internal Medicine

## 2020-05-26 DIAGNOSIS — Z20822 Contact with and (suspected) exposure to covid-19: Secondary | ICD-10-CM | POA: Diagnosis not present

## 2020-05-26 MED ORDER — AMLODIPINE BESYLATE 5 MG PO TABS: 5.0000 mg | ORAL_TABLET | Freq: Two times a day (BID) | ORAL | 1 refills | Status: DC

## 2020-05-26 NOTE — Telephone Encounter (Signed)
A new Rx needs to be called to [facility] on [address] for Martin Rodriguez with the [facility] on [address]

## 2020-06-02 ENCOUNTER — Other Ambulatory Visit (INDEPENDENT_AMBULATORY_CARE_PROVIDER_SITE_OTHER): Payer: Self-pay | Admitting: Internal Medicine

## 2020-07-03 ENCOUNTER — Other Ambulatory Visit (INDEPENDENT_AMBULATORY_CARE_PROVIDER_SITE_OTHER): Payer: Self-pay | Admitting: Nurse Practitioner

## 2020-07-05 ENCOUNTER — Ambulatory Visit (INDEPENDENT_AMBULATORY_CARE_PROVIDER_SITE_OTHER): Payer: BC Managed Care – PPO | Admitting: Internal Medicine

## 2020-08-20 ENCOUNTER — Other Ambulatory Visit (INDEPENDENT_AMBULATORY_CARE_PROVIDER_SITE_OTHER): Payer: Self-pay | Admitting: Internal Medicine

## 2020-08-20 NOTE — Telephone Encounter (Signed)
Please call this patient and see if you can get him scheduled for a follow-up within the next 2 months. I did send a refill of his med to his pharmacy to hold him over until he can be seen here.

## 2020-08-23 ENCOUNTER — Ambulatory Visit (INDEPENDENT_AMBULATORY_CARE_PROVIDER_SITE_OTHER): Payer: BC Managed Care – PPO | Admitting: Internal Medicine

## 2020-08-24 ENCOUNTER — Other Ambulatory Visit (INDEPENDENT_AMBULATORY_CARE_PROVIDER_SITE_OTHER): Payer: Self-pay | Admitting: Internal Medicine

## 2020-09-15 ENCOUNTER — Other Ambulatory Visit (INDEPENDENT_AMBULATORY_CARE_PROVIDER_SITE_OTHER): Payer: Self-pay | Admitting: Internal Medicine

## 2020-12-08 ENCOUNTER — Other Ambulatory Visit (INDEPENDENT_AMBULATORY_CARE_PROVIDER_SITE_OTHER): Payer: Self-pay | Admitting: Internal Medicine

## 2021-05-22 IMAGING — CR DG LUMBAR SPINE COMPLETE 4+V
5 series · 5 of 5 positions shown · non-contrast
Comparison: None.

CLINICAL DATA: Disc degenerative disease of the lumbar spine

EXAM:
LUMBAR SPINE - COMPLETE 4+ VIEW

[t lumbar spine ap]
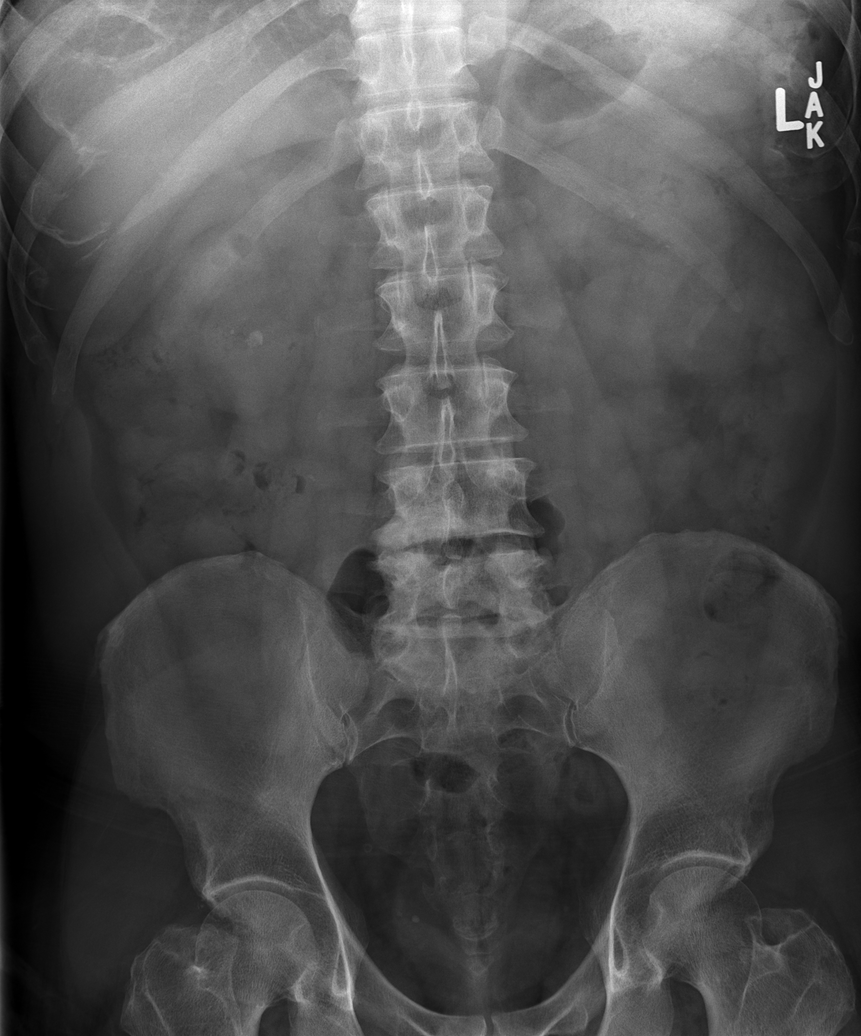

[t lumbar spine obl (1 of 2)]
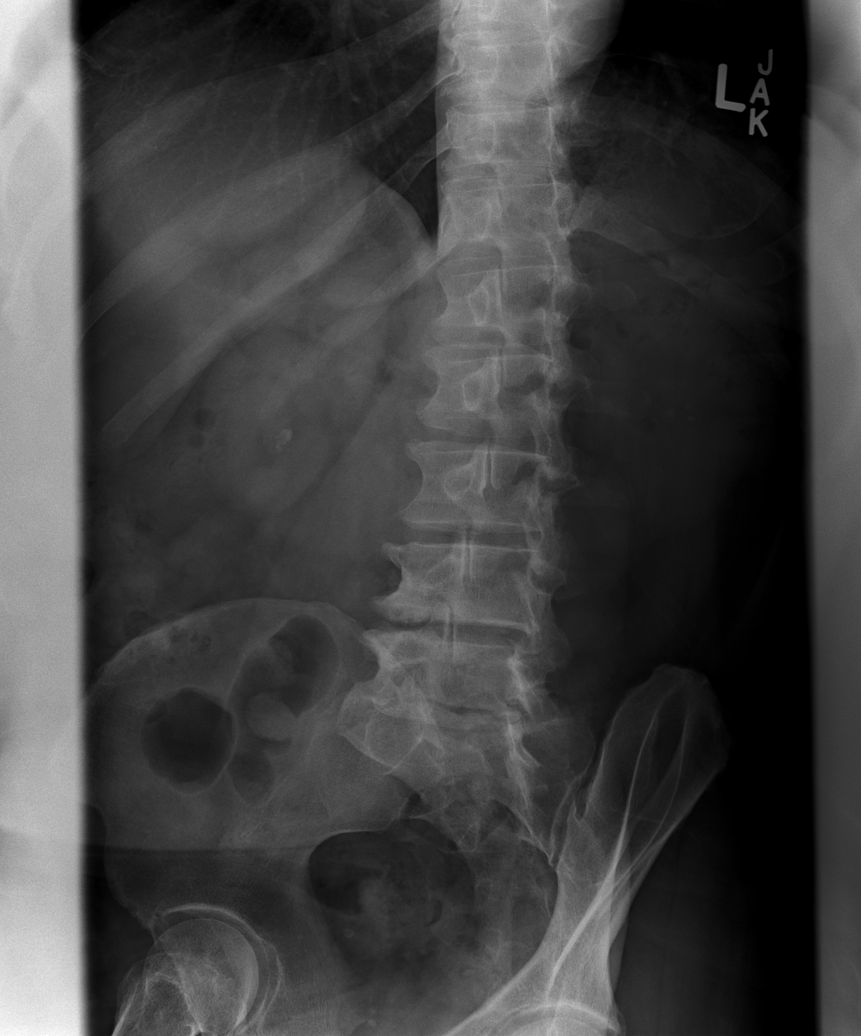

[t lumbar spine obl (2 of 2)]
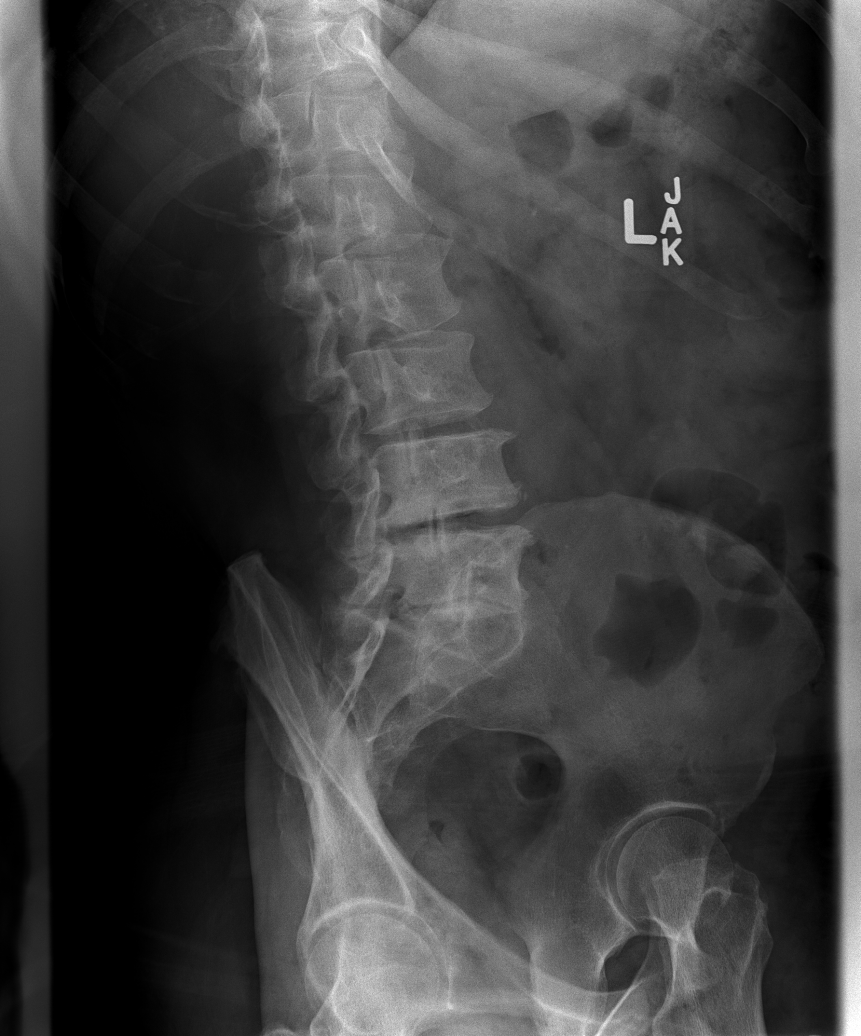

[t lumbar spine lat]
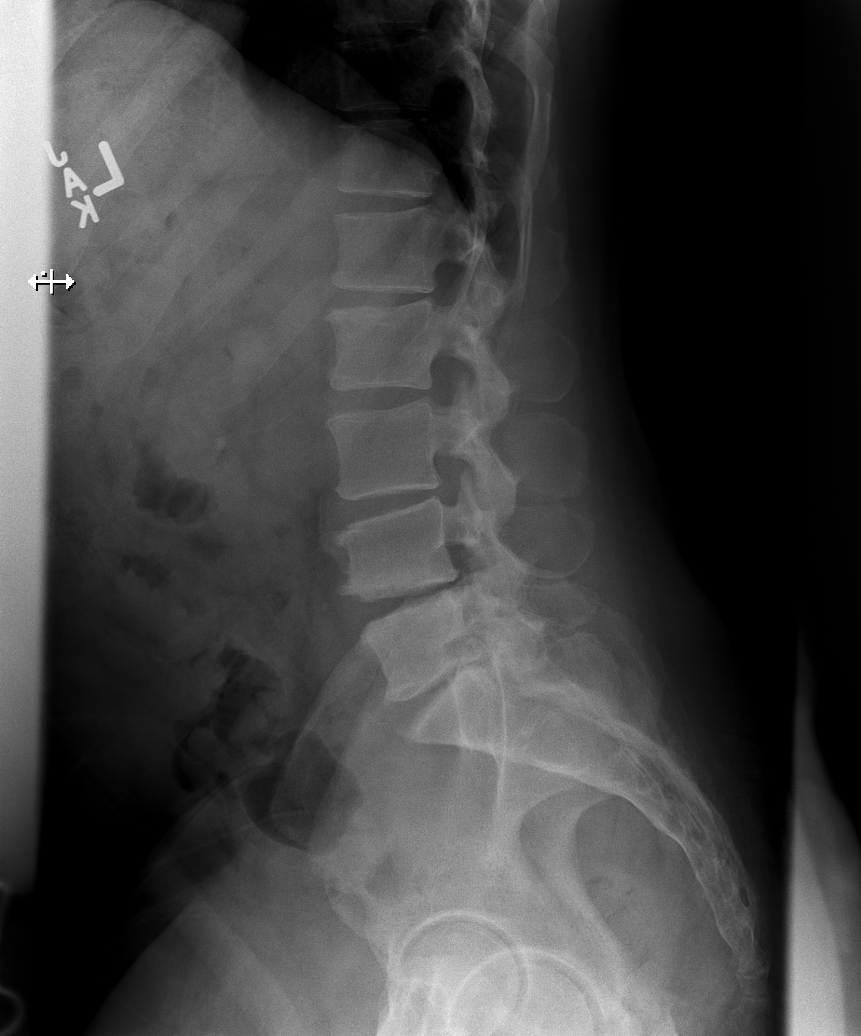

[t lumbar l-5 s-1 spot]
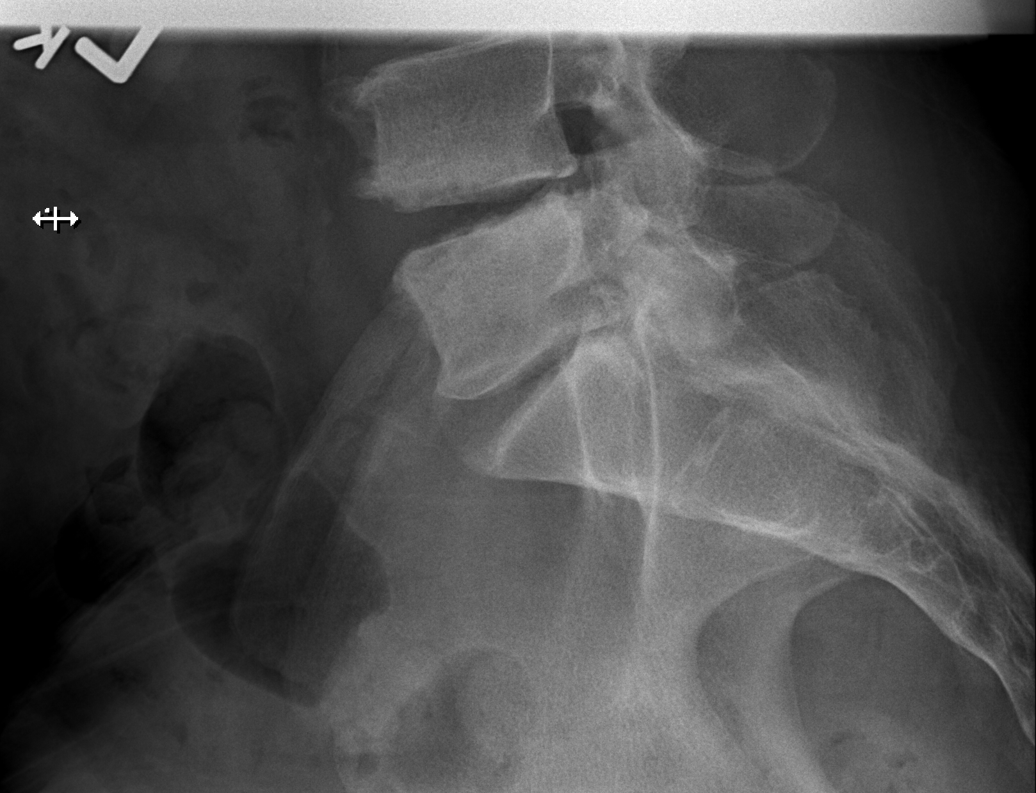

[5 of 5 positions shown; findings below may reference images not displayed]

FINDINGS: No fracture or dislocation of the lumbar spine. There is moderate
disc space height loss and osteophytosis of L4 and L5. The remaining
disc spaces are mostly intact with minimal height loss and
osteophytosis. Moderate facet degenerative change of the lower
lumbar spine. Nonobstructive pattern of overlying bowel gas.
Probable small bilateral renal calculi.
IMPRESSION: 1. No fracture or dislocation of the lumbar spine. There is moderate
disc space height loss and osteophytosis of L4 and L5. The remaining
disc spaces are mostly intact with minimal height loss and
osteophytosis.

2.  Moderate facet degenerative change of the lower lumbar spine.

3. Lumbar disc and neural foraminal pathology may be further
evaluated by MRI if indicated by localizing signs and symptoms.

4.  Probable nonobstructive bilateral nephrolithiasis.

## 2022-02-21 ENCOUNTER — Ambulatory Visit (INDEPENDENT_AMBULATORY_CARE_PROVIDER_SITE_OTHER): Payer: MEDICARE | Admitting: Family Medicine

## 2022-02-21 ENCOUNTER — Encounter: Payer: Self-pay | Admitting: Family Medicine

## 2022-02-21 VITALS — BP 119/83 | HR 91 | Temp 97.8°F | Ht 65.0 in | Wt 178.8 lb

## 2022-02-21 DIAGNOSIS — Z130 Encounter for screening for diseases of the blood and blood-forming organs and certain disorders involving the immune mechanism: Secondary | ICD-10-CM

## 2022-02-21 DIAGNOSIS — M75102 Unspecified rotator cuff tear or rupture of left shoulder, not specified as traumatic: Secondary | ICD-10-CM

## 2022-02-21 DIAGNOSIS — G8929 Other chronic pain: Secondary | ICD-10-CM | POA: Diagnosis not present

## 2022-02-21 DIAGNOSIS — M19019 Primary osteoarthritis, unspecified shoulder: Secondary | ICD-10-CM

## 2022-02-21 DIAGNOSIS — F419 Anxiety disorder, unspecified: Secondary | ICD-10-CM

## 2022-02-21 DIAGNOSIS — M25512 Pain in left shoulder: Secondary | ICD-10-CM | POA: Diagnosis not present

## 2022-02-21 DIAGNOSIS — R7303 Prediabetes: Secondary | ICD-10-CM

## 2022-02-21 DIAGNOSIS — I10 Essential (primary) hypertension: Secondary | ICD-10-CM

## 2022-02-21 DIAGNOSIS — Z125 Encounter for screening for malignant neoplasm of prostate: Secondary | ICD-10-CM

## 2022-02-21 DIAGNOSIS — E782 Mixed hyperlipidemia: Secondary | ICD-10-CM

## 2022-02-21 NOTE — Assessment & Plan Note (Signed)
Stable.  Continue Celexa and Xanax. ?

## 2022-02-21 NOTE — Assessment & Plan Note (Signed)
Patient has known arthritis.  Suspect that he has rotator cuff tear as well.  Arranging for MRI. ?

## 2022-02-21 NOTE — Assessment & Plan Note (Signed)
BP stable on lisinopril.  Continue. ?

## 2022-02-21 NOTE — Progress Notes (Signed)
? ?Subjective:  ?Patient ID: Martin Rodriguez, male    DOB: December 28, 1955  Age: 66 y.o. MRN: [id number] ? ?CC: ?Chief Complaint  ?Patient presents with  ? Establish Care  ?  Pt does not need refills at this time. ?Left should pain; pt has had cortisone injections.   ? ? ?HPI: ? ?66 year old male with hypertension, anxiety, and hyperlipidemia presents to establish care. ? ?Patient reports ongoing issues with his left shoulder.  He states that he has decreased range of motion, popping and grinding, and associated pain.  He has previously had this x-rayed and has had corticosteroid injections.  He states that it worked temporarily but that he continues to have difficulty.  He is right-handed.  He has not had any recent advanced imaging. ? ?Patient has known hyperlipidemia.  He is currently on atorvastatin.  Needs labs. ? ?Patient's anxiety is stable on Celexa and as needed Xanax. ? ?Patient Active Problem List  ? Diagnosis Date Noted  ? Chronic left shoulder pain 02/21/2022  ? Essential hypertension, benign 03/17/2020  ? Anxiety 03/17/2020  ? HLD (hyperlipidemia) 03/17/2020  ? ? ?Social Hx   ?Social History  ? ?Socioeconomic History  ? Marital status: Divorced  ?  Spouse name: Not on file  ? Number of children: Not on file  ? Years of education: Not on file  ? Highest education level: Not on file  ?Occupational History  ? Occupation: retired  ?  Comment: [employer]  ?Tobacco Use  ? Smoking status: Never  ? Smokeless tobacco: Never  ?Vaping Use  ? Vaping Use: Not on file  ?Substance and Sexual Activity  ? Alcohol use: Yes  ?  Alcohol/week: 2.0 standard drinks  ?  Types: 1 Glasses of wine, 1 Cans of beer per week  ?  Comment: occasional  ? Drug use: Never  ? Sexual activity: Not on file  ?Other Topics Concern  ? Not on file  ?Social History Narrative  ? Single,divorced.Lives with ex-wife.Retired from [employer] as Therapist, occupational.Moved from [place] October 2020.  ? ?Social Determinants of Health  ? ?Financial Resource Strain:  Not on file  ?Food Insecurity: Not on file  ?Transportation Needs: Not on file  ?Physical Activity: Not on file  ?Stress: Not on file  ?Social Connections: Not on file  ? ? ?Review of Systems ?Per HPI ? ?Objective:  ?BP 119/83   Pulse 91   Temp 97.8 ?F (36.6 ?C)   Ht $R'5\' 5"'ek$  (1.651 m)   Wt 178 lb 12.8 oz (81.1 kg)   SpO2 96%   BMI 29.75 kg/m?  ? ? ?  02/21/2022  ?  9:55 AM 05/19/2020  ?  2:19 PM 04/21/2020  ?  2:22 PM  ?BP/Weight  ?Systolic BP 449 675 916  ?Diastolic BP 83 75 80  ?Wt. (Lbs) 178.8 177 183  ?BMI 29.75 kg/m2 27.72 kg/m2 28.66 kg/m2  ? ? ?Physical Exam ?Vitals and nursing note reviewed.  ?Constitutional:   ?   General: He is not in acute distress. ?   Appearance: Normal appearance. He is not ill-appearing.  ?HENT:  ?   Head: Normocephalic and atraumatic.  ?Eyes:  ?   General:     ?   Right eye: No discharge.     ?   Left eye: No discharge.  ?   Conjunctiva/sclera: Conjunctivae normal.  ?Cardiovascular:  ?   Rate and Rhythm: Normal rate and regular rhythm.  ?Pulmonary:  ?   Effort: Pulmonary effort is normal.  ?  Breath sounds: Normal breath sounds. No wheezing or rales.  ?Musculoskeletal:  ?   Comments: Shoulder: Left ?Markedly decreased range of motion in abduction and flexion. ?Supraspinatus and subscapularis muscle strength 4/5.  ?Popping and grinding noted with range of motion. ? ?  ?Neurological:  ?   Mental Status: He is alert.  ?Psychiatric:     ?   Mood and Affect: Mood normal.     ?   Behavior: Behavior normal.  ? ? ?Lab Results  ?Component Value Date  ? WBC 6.5 02/10/2020  ? HGB 14.7 02/10/2020  ? HCT 43.6 02/10/2020  ? PLT 246 02/10/2020  ? GLUCOSE 102 (H) 04/21/2020  ? CHOL 245 (H) 04/21/2020  ? TRIG 203 (H) 04/21/2020  ? HDL 48 04/21/2020  ? LDLCALC 161 (H) 04/21/2020  ? ALT 25 04/21/2020  ? AST 21 04/21/2020  ? NA 141 04/21/2020  ? K 3.8 04/21/2020  ? CL 103 04/21/2020  ? CREATININE 0.94 04/21/2020  ? BUN 17 04/21/2020  ? CO2 28 04/21/2020  ? TSH 3.57 02/10/2020  ? PSA 0.8 02/10/2020  ?  HGBA1C 5.7 (H) 02/10/2020  ? ? ? ?Assessment & Plan:  ? ?Problem List Items Addressed This Visit   ? ?  ? Cardiovascular and Mediastinum  ? Essential hypertension, benign  ?  BP stable on lisinopril.  Continue. ? ?  ?  ? Relevant Medications  ? atorvastatin (LIPITOR) 10 MG tablet  ? Other Relevant Orders  ? CMP14+EGFR  ?  ? Other  ? Anxiety  ?  Stable.  Continue Celexa and Xanax. ? ?  ?  ? Relevant Medications  ? ALPRAZolam (XANAX) 0.5 MG tablet  ? HLD (hyperlipidemia)  ?  Unsure of control.  Lipid panel today.  Continue atorvastatin. ? ?  ?  ? Relevant Medications  ? atorvastatin (LIPITOR) 10 MG tablet  ? Other Relevant Orders  ? Lipid panel  ? Chronic left shoulder pain - Primary  ?  Patient has known arthritis.  Suspect that he has rotator cuff tear as well.  Arranging for MRI. ? ?  ?  ? Relevant Orders  ? MR Shoulder Left Wo Contrast  ? ?Other Visit Diagnoses   ? ? Shoulder arthritis      ? Relevant Orders  ? MR Shoulder Left Wo Contrast  ? Nontraumatic tear of left rotator cuff, unspecified tear extent      ? Relevant Orders  ? MR Shoulder Left Wo Contrast  ? Screening for deficiency anemia      ? Relevant Orders  ? CBC  ? Prediabetes      ? Relevant Orders  ? Hemoglobin A1c  ? Prostate cancer screening      ? Relevant Orders  ? PSA  ? ?  ? ?Follow-up:  Return in about 6 months (around 08/23/2022). ? ?Thersa Salt DO ?[place] ? ?

## 2022-02-21 NOTE — Patient Instructions (Signed)
Labs ordered.  Do them when you can. ? ?I have ordered the MRI.  We will work on getting this approved and getting it scheduled. ? ?Follow up in 6 months. ? ?Take care ? ?Dr. Adriana Simas  ?

## 2022-02-21 NOTE — Assessment & Plan Note (Signed)
Unsure of control.  Lipid panel today.  Continue atorvastatin. 

## 2022-02-22 LAB — CMP14+EGFR
ALT: 25 IU/L (ref 0–44)
AST: 23 IU/L (ref 0–40)
Albumin/Globulin Ratio: 2.2 (ref 1.2–2.2)
Albumin: 4.6 g/dL (ref 3.8–4.8)
Alkaline Phosphatase: 74 IU/L (ref 44–121)
BUN/Creatinine Ratio: 18 (ref 10–24)
BUN: 21 mg/dL (ref 8–27)
Bilirubin Total: 0.4 mg/dL (ref 0.0–1.2)
CO2: 24 mmol/L (ref 20–29)
Calcium: 9.8 mg/dL (ref 8.6–10.2)
Chloride: 102 mmol/L (ref 96–106)
Creatinine, Ser: 1.2 mg/dL (ref 0.76–1.27)
Globulin, Total: 2.1 g/dL (ref 1.5–4.5)
Glucose: 107 mg/dL — ABNORMAL HIGH (ref 70–99)
Potassium: 4.1 mmol/L (ref 3.5–5.2)
Sodium: 141 mmol/L (ref 134–144)
Total Protein: 6.7 g/dL (ref 6.0–8.5)
eGFR: 68 mL/min/{1.73_m2} (ref >59)

## 2022-02-22 LAB — LIPID PANEL
Chol/HDL Ratio: 3.2 ratio (ref 0.0–5.0)
Cholesterol, Total: 147 mg/dL (ref 100–199)
HDL: 46 mg/dL (ref >39)
LDL Chol Calc (NIH): 73 mg/dL (ref 0–99)
Triglycerides: 166 mg/dL — ABNORMAL HIGH (ref 0–149)
VLDL Cholesterol Cal: 28 mg/dL (ref 5–40)

## 2022-02-22 LAB — CBC
Hematocrit: 42.8 % (ref 37.5–51.0)
Hemoglobin: 14.7 g/dL (ref 13.0–17.7)
MCH: 31.5 pg (ref 26.6–33.0)
MCHC: 34.3 g/dL (ref 31.5–35.7)
MCV: 92 fL (ref 79–97)
Platelets: 239 10*3/uL (ref 150–450)
RBC: 4.67 x10E6/uL (ref 4.14–5.80)
RDW: 13.6 % (ref 11.6–15.4)
WBC: 7.4 10*3/uL (ref 3.4–10.8)

## 2022-02-22 LAB — HEMOGLOBIN A1C
Est. average glucose Bld gHb Est-mCnc: 120 mg/dL
Hgb A1c MFr Bld: 5.8 % — ABNORMAL HIGH (ref 4.8–5.6)

## 2022-02-22 LAB — PSA: Prostate Specific Ag, Serum: 0.7 ng/mL (ref 0.0–4.0)

## 2022-05-03 ENCOUNTER — Other Ambulatory Visit: Payer: Self-pay | Admitting: *Deleted

## 2022-05-03 ENCOUNTER — Telehealth: Payer: Self-pay | Admitting: Family Medicine

## 2022-05-03 MED ORDER — CITALOPRAM HYDROBROMIDE 20 MG PO TABS: 20.0000 mg | ORAL_TABLET | Freq: Every day | ORAL | 0 refills | Status: DC

## 2022-05-03 MED ORDER — LISINOPRIL 20 MG PO TABS: 20.0000 mg | ORAL_TABLET | Freq: Every day | ORAL | 1 refills | Status: DC

## 2022-05-03 NOTE — Telephone Encounter (Signed)
Patient called in he also needs refill on lipitor, and citzlopram as well as the lisinopril.

## 2022-05-03 NOTE — Telephone Encounter (Signed)
Medications sent in. Pt notified

## 2022-05-05 ENCOUNTER — Ambulatory Visit (INDEPENDENT_AMBULATORY_CARE_PROVIDER_SITE_OTHER): Payer: MEDICARE | Admitting: Family Medicine

## 2022-05-05 VITALS — BP 129/86 | HR 83 | Temp 97.3°F | Ht 65.0 in | Wt 183.0 lb

## 2022-05-05 DIAGNOSIS — G4733 Obstructive sleep apnea (adult) (pediatric): Secondary | ICD-10-CM | POA: Diagnosis not present

## 2022-05-05 DIAGNOSIS — G8929 Other chronic pain: Secondary | ICD-10-CM

## 2022-05-05 DIAGNOSIS — M25512 Pain in left shoulder: Secondary | ICD-10-CM | POA: Diagnosis not present

## 2022-05-05 DIAGNOSIS — I10 Essential (primary) hypertension: Secondary | ICD-10-CM

## 2022-05-05 DIAGNOSIS — F419 Anxiety disorder, unspecified: Secondary | ICD-10-CM

## 2022-05-05 MED ORDER — MELOXICAM 15 MG PO TABS: 15.0000 mg | ORAL_TABLET | Freq: Every day | ORAL | 0 refills | Status: DC | PRN

## 2022-05-05 MED ORDER — ALPRAZOLAM 0.5 MG PO TABS: 0.5000 mg | ORAL_TABLET | Freq: Two times a day (BID) | ORAL | 3 refills | Status: DC | PRN

## 2022-05-05 NOTE — Assessment & Plan Note (Signed)
Stable. Xanax refilled.  

## 2022-05-05 NOTE — Patient Instructions (Signed)
We will be in touch this week regarding your MRI.  Follow up in 6 months.  Take care  Dr. Adriana Simas

## 2022-05-05 NOTE — Assessment & Plan Note (Signed)
Stable. Continue lisinopril. Refilled today. °

## 2022-05-05 NOTE — Assessment & Plan Note (Addendum)
Persistent and worsening.  Patient's MRI has now been scheduled.  Referral placed to orthopedic surgery.  Meloxicam as directed.

## 2022-05-05 NOTE — Assessment & Plan Note (Signed)
Patient Dors is a prior history of suspected sleep apnea.  Needs sleep study.

## 2022-05-05 NOTE — Progress Notes (Signed)
Subjective:  Patient ID: Martin Rodriguez, male    DOB: [birth year]  Age: 66 y.o. MRN: [id number]  CC: Chief Complaint  Patient presents with   Follow-up    Xanax and lisinopril refill , didnt hear back from insurance about L shoulder MRI     HPI:  66 year old male with hypertension, anxiety, hyperlipidemia, and chronic shoulder pain presents for follow-up.  Blood pressure is stable.  Needs refill on lisinopril.  Patient continues to have ongoing left shoulder pain.  MRI has not been scheduled for an unknown reason.  We will remedy this today.  He states that he now has further decreased range of motion and overall seems to be worsening.  Anxiety stable.  He is in need of refill on his alprazolam.  Additionally, patient reports that he suspects that he has sleep apnea.  He had a prior sleep evaluation many years ago that was inconclusive.  He reports daytime sleepiness and fatigue.  Patient Active Problem List   Diagnosis Date Noted   OSA (obstructive sleep apnea) 05/05/2022   Chronic left shoulder pain 02/21/2022   Essential hypertension, benign 03/17/2020   Anxiety 03/17/2020   HLD (hyperlipidemia) 03/17/2020    Social Hx   Social History   Socioeconomic History   Marital status: Divorced    Spouse name: Not on file   Number of children: Not on file   Years of education: Not on file   Highest education level: Not on file  Occupational History   Occupation: retired    Comment: [employer]  Tobacco Use   Smoking status: Never   Smokeless tobacco: Never  Vaping Use   Vaping Use: Not on file  Substance and Sexual Activity   Alcohol use: Yes    Alcohol/week: 2.0 standard drinks of alcohol    Types: 1 Glasses of wine, 1 Cans of beer per week    Comment: occasional   Drug use: Never   Sexual activity: Not on file  Other Topics Concern   Not on file  Social History Narrative   Single,divorced.Lives with ex-wife.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   Social Determinants of Health   Financial Resource Strain: Not on file  Food Insecurity: Not on file  Transportation Needs: Not on file  Physical Activity: Not on file  Stress: Not on file  Social Connections: Not on file    Review of Systems Per HPI  Objective:  BP 129/86   Pulse 83   Temp (!) 97.3 F (36.3 C)   Ht 5\' 5"  (1.651 m)   Wt 183 lb (83 kg)   SpO2 99%   BMI 30.45 kg/m      05/05/2022    1:05 PM 02/21/2022    9:55 AM 05/19/2020    2:19 PM  BP/Weight  Systolic BP 129 119 115  Diastolic BP 86 83 75  Wt. (Lbs) 183 178.8 177  BMI 30.45 kg/m2 29.75 kg/m2 27.72 kg/m2    Physical Exam Constitutional:      General: He is not in acute distress.    Appearance: Normal appearance. He is not ill-appearing.  HENT:     Head: Normocephalic and atraumatic.  Eyes:     General:        Right eye: No discharge.        Left eye: No discharge.     Conjunctiva/sclera: Conjunctivae normal.  Cardiovascular:     Rate and Rhythm: Normal rate and regular rhythm.  Pulmonary:  Effort: Pulmonary effort is normal.     Breath sounds: Normal breath sounds. No wheezing, rhonchi or rales.  Musculoskeletal:     Comments: Decreased range of motion of the left shoulder.  Neurological:     Mental Status: He is alert.  Psychiatric:        Mood and Affect: Mood normal.        Behavior: Behavior normal.     Lab Results  Component Value Date   WBC 7.4 02/21/2022   HGB 14.7 02/21/2022   HCT 42.8 02/21/2022   PLT 239 02/21/2022   GLUCOSE 107 (H) 02/21/2022   CHOL 147 02/21/2022   TRIG 166 (H) 02/21/2022   HDL 46 02/21/2022   LDLCALC 73 02/21/2022   ALT 25 02/21/2022   AST 23 02/21/2022   NA 141 02/21/2022   K 4.1 02/21/2022   CL 102 02/21/2022   CREATININE 1.20 02/21/2022   BUN 21 02/21/2022   CO2 24 02/21/2022   TSH 3.57 02/10/2020   PSA 0.8 02/10/2020   HGBA1C 5.8 (H) 02/21/2022     Assessment & Plan:   Problem List Items Addressed This  Visit       Cardiovascular and Mediastinum   Essential hypertension, benign    Stable.  Continue lisinopril.  Refilled today.        Respiratory   OSA (obstructive sleep apnea)    Patient Dors is a prior history of suspected sleep apnea.  Needs sleep study.      Relevant Orders   Ambulatory referral to Sleep Studies     Other   Anxiety    Stable.  Xanax refilled.      Relevant Medications   ALPRAZolam (XANAX) 0.5 MG tablet   Chronic left shoulder pain - Primary    Persistent and worsening.  Patient's MRI has now been scheduled.  Referral placed to orthopedic surgery.  Meloxicam as directed.      Relevant Medications   meloxicam (MOBIC) 15 MG tablet   Other Relevant Orders   Ambulatory referral to Orthopedic Surgery    Meds ordered this encounter  Medications   ALPRAZolam (XANAX) 0.5 MG tablet    Sig: Take 1 tablet (0.5 mg total) by mouth 2 (two) times daily as needed.    Dispense:  30 tablet    Refill:  3   meloxicam (MOBIC) 15 MG tablet    Sig: Take 1 tablet (15 mg total) by mouth daily as needed for pain.    Dispense:  30 tablet    Refill:  0   Krishawna Stiefel DO [facility]

## 2022-05-10 ENCOUNTER — Ambulatory Visit (HOSPITAL_COMMUNITY)
Admission: RE | Admit: 2022-05-10 | Discharge: 2022-05-10 | Disposition: A | Discharge status: Home / Self Care | Payer: MEDICARE | Source: Ambulatory Visit | Attending: Family Medicine | Admitting: Family Medicine

## 2022-05-10 DIAGNOSIS — G8929 Other chronic pain: Secondary | ICD-10-CM | POA: Diagnosis present

## 2022-05-10 DIAGNOSIS — M25512 Pain in left shoulder: Secondary | ICD-10-CM | POA: Insufficient documentation

## 2022-05-10 DIAGNOSIS — M75102 Unspecified rotator cuff tear or rupture of left shoulder, not specified as traumatic: Secondary | ICD-10-CM | POA: Insufficient documentation

## 2022-05-10 DIAGNOSIS — M19019 Primary osteoarthritis, unspecified shoulder: Secondary | ICD-10-CM | POA: Diagnosis present

## 2022-05-15 ENCOUNTER — Other Ambulatory Visit: Payer: Self-pay | Admitting: Orthopedic Surgery

## 2022-05-15 DIAGNOSIS — Z01818 Encounter for other preprocedural examination: Secondary | ICD-10-CM

## 2022-05-19 ENCOUNTER — Telehealth: Payer: Self-pay | Admitting: Adult Health

## 2022-05-19 NOTE — Telephone Encounter (Signed)
I have never seen this patient . He is not a patient of ours  Has upcoming ov for sleep consult next week will not be able to do pre op until established please send to PCP

## 2022-05-19 NOTE — Telephone Encounter (Signed)
Fax received from Dr. Teryl Lucy with [facility] to perform a left total shoulder replacement on patient.  Patient needs surgery clearance. Surgery is PENDING. Patient scheduled to be seen on 05/25/2022. Office protocol is a risk assessment can be sent to surgeon if patient has been seen in 60 days or less.   Sending to Rubye Oaks NP for risk assessment or recommendations if patient needs to be seen in office prior to surgical procedure.

## 2022-05-22 NOTE — Telephone Encounter (Signed)
Dr Adriana Simas,   Per Rubye Oaks NP  I have never seen this patient . He is not a patient of ours  Has upcoming ov for sleep consult next week will not be able to do pre op until established please send to PCP

## 2022-05-25 ENCOUNTER — Other Ambulatory Visit: Payer: MEDICARE

## 2022-05-25 ENCOUNTER — Encounter: Payer: Self-pay | Admitting: Adult Health

## 2022-05-25 ENCOUNTER — Ambulatory Visit (INDEPENDENT_AMBULATORY_CARE_PROVIDER_SITE_OTHER): Payer: MEDICARE | Admitting: Adult Health

## 2022-05-25 VITALS — BP 100/70 | HR 95 | Temp 98.0°F | Ht 66.0 in | Wt 183.4 lb

## 2022-05-25 DIAGNOSIS — G4733 Obstructive sleep apnea (adult) (pediatric): Secondary | ICD-10-CM

## 2022-05-25 NOTE — Progress Notes (Signed)
Received 2 initial [place] vaccines

## 2022-05-25 NOTE — Progress Notes (Signed)
@Patient  ID: , male    DOB: 11/18/1955, 66 y.o.   MRN: [id number]  Chief Complaint  Patient presents with   Consult    Referring provider: [id number], DO  HPI: 66 year old male seen for sleep consult May 25, 2022 for snoring, daytime sleepiness and restless sleep  TEST/EVENTS :   05/25/2022 Sleep consult  Patient presents for sleep consult today.  Kindly referred by his primary care provider Dr. 05/27/2022.  Patient presents today complaining of snoring, daytime sleepiness, restless sleep.  Wakes up feeling tired no matter how much he sleeps.  Naps most days. Patient says he had a sleep study about 12 years ago when he lived in [place].  He was told he had borderline sleep apnea at that time.  Has never used CPAP.  Patient typically goes to bed about 10 PM to 1 AM.  Watches TV to go bed, has trouble falling asleep some night. Takes about 30 minutes to go to sleep.  Is up multiple times throughout the night.  Gets up around 10 am. Has tried melatonin without any help with sleep . Has tried Xanax without any benefit. Has not tried Ambien or Lunesta.   Patient is retired.  Epworth score is 6 out of 24.  Typically gets sleepy watching TV and in the afternoon hours.  Denies any symptoms suspicious for cataplexy or sleep paralysis. Has chronic pain in shoulder , has rotator cuff tear and is considering surgery. Pain wakes him up at night . Caffeine intake none .  Denies any history of stroke or congestive heart failure.  Has upper dentures.  Current weight is at 183 pounds with a BMI at 34    Social history Patient is divorced.  Has adult children.  He is retired.  Never smoker.  No alcohol or drug use. Previously worked for the [employer].   Family history positive for bladder cancer  Medical history significant for hypertension, asthma, chronic allergies, chronic headaches, hyperlipidemia, anxiety, GERD  Surgical history positive for tonsillectomy    Allergies  Allergen  Reactions   Penicillins Shortness Of Breath and Swelling   Sulfa Antibiotics Hives and Itching     There is no immunization history on file for this patient.  Past Medical History:  Diagnosis Date   Allergy    seasonal   Anxiety    Back pain    COPD (chronic obstructive pulmonary disease) (HCC)    Essential hypertension, benign 03/17/2020   HLD (hyperlipidemia) 03/17/2020   Prediabetes 03/17/2020   Shoulder pain     Tobacco History: Social History   Tobacco Use  Smoking Status Never  Smokeless Tobacco Never   Counseling given: Not Answered   Outpatient Medications Prior to Visit  Medication Sig Dispense Refill   ALPRAZolam (XANAX) 0.5 MG tablet Take 1 tablet (0.5 mg total) by mouth 2 (two) times daily as needed. 30 tablet 3   aspirin-acetaminophen-caffeine (EXCEDRIN MIGRAINE) 250-250-65 MG tablet Take 2 tablets by mouth every 6 (six) hours as needed for headache.     atorvastatin (LIPITOR) 10 MG tablet TAKE 1 TABLET BY MOUTH EVERY DAY 90 tablet 0   B Complex Vitamins (B COMPLEX PO) Take by mouth.     Cholecalciferol (VITAMIN D-3) 125 MCG (5000 UT) TABS Take 2 tablets by mouth daily.     citalopram (CELEXA) 20 MG tablet Take 1 tablet (20 mg total) by mouth daily. 90 tablet 0   lisinopril (ZESTRIL) 20 MG tablet Take 1 tablet (20 mg  total) by mouth daily. 90 tablet 1   loratadine (CLARITIN) 10 MG tablet Take 10 mg by mouth daily as needed.     Multiple Vitamin (MULTIVITAMIN ADULT PO) Take by mouth.     Omeprazole Magnesium (PRILOSEC OTC PO) Take by mouth.     vitamin k 100 MCG tablet Take 100 mcg by mouth daily.     meloxicam (MOBIC) 15 MG tablet Take 1 tablet (15 mg total) by mouth daily as needed for pain. (Patient not taking: Reported on 05/25/2022) 30 tablet 0   No facility-administered medications prior to visit.     Review of Systems:   Constitutional:   No  weight loss, night sweats,  Fevers, chills, + fatigue, or  lassitude.  HEENT:   No headaches,  Difficulty  swallowing,  Tooth/dental problems, or  Sore throat,                No sneezing, itching, ear ache, nasal congestion, post nasal drip,   CV:  No chest pain,  Orthopnea, PND, swelling in lower extremities, anasarca, dizziness, palpitations, syncope.   GI  No heartburn, indigestion, abdominal pain, nausea, vomiting, diarrhea, change in bowel habits, loss of appetite, bloody stools.   Resp: No shortness of breath with exertion or at rest.  No excess mucus, no productive cough,  No non-productive cough,  No coughing up of blood.  No change in color of mucus.  No wheezing.  No chest wall deformity  Skin: no rash or lesions.  GU: no dysuria, change in color of urine, no urgency or frequency.  No flank pain, no hematuria   MS:  shoulder pain    Physical Exam  BP 100/70 (BP Location: Left Arm, Patient Position: Sitting, Cuff Size: Large)   Pulse 95   Temp 98 F (36.7 C) (Oral)   Ht 5\' 6"  (1.676 m)   Wt 183 lb 6.4 oz (83.2 kg)   SpO2 97%   BMI 29.60 kg/m   GEN: A/Ox3; pleasant , NAD, well nourished    HEENT:  [place]/AT,   NOSE-clear, THROAT-clear, no lesions, no postnasal drip or exudate noted. Class 2 MP airway   NECK:  Supple w/ fair ROM; no JVD; normal carotid impulses w/o bruits; no thyromegaly or nodules palpated; no lymphadenopathy.    RESP  Clear  P & A; w/o, wheezes/ rales/ or rhonchi. no accessory muscle use, no dullness to percussion  CARD:  RRR, no m/r/g, no peripheral edema, pulses intact, no cyanosis or clubbing.  GI:   Soft & nt; nml bowel sounds; no organomegaly or masses detected.   Musco: Warm bil, no deformities or joint swelling noted.   Neuro: alert, no focal deficits noted.    Skin: Warm, no lesions or rashes    Lab Results:    BMET   BNP No results found for: "BNP"  ProBNP No results found for: "PROBNP"  Imaging:       No data to display          No results found for: "NITRICOXIDE"      Assessment & Plan:   OSA (obstructive sleep  apnea) History of borderline OSA . Ongoing snoring, daytime sleepiness and restless sleep  All suspicious for OSA  - discussed how weight can impact sleep and risk for sleep disordered breathing - discussed options to assist with weight loss: combination of diet modification, cardiovascular and strength training exercises   - had an extensive discussion regarding the adverse health consequences related to untreated sleep  disordered breathing - specifically discussed the risks for hypertension, coronary artery disease, cardiac dysrhythmias, cerebrovascular disease, and diabetes - lifestyle modification discussed   - discussed how sleep disruption can increase risk of accidents, particularly when driving - safe driving practices were discussed   Plan  Patient Instructions  Set up for home sleep study Healthy sleep regimen Do not drive if sleepy Work on healthy weight loss Follow-up in 6 to 8 weeks to discuss sleep study results and treatment plan        Rubye [place], NP 05/25/2022

## 2022-05-25 NOTE — Patient Instructions (Signed)
Set up for home sleep study. Healthy sleep regimen  Do not drive if sleepy  Work on healthy weight loss Follow-up in 6 to 8 weeks to discuss sleep study results and treatment plan 

## 2022-05-25 NOTE — Assessment & Plan Note (Signed)
History of borderline OSA . Ongoing snoring, daytime sleepiness and restless sleep  All suspicious for OSA  - discussed how weight can impact sleep and risk for sleep disordered breathing - discussed options to assist with weight loss: combination of diet modification, cardiovascular and strength training exercises   - had an extensive discussion regarding the adverse health consequences related to untreated sleep disordered breathing - specifically discussed the risks for hypertension, coronary artery disease, cardiac dysrhythmias, cerebrovascular disease, and diabetes - lifestyle modification discussed   - discussed how sleep disruption can increase risk of accidents, particularly when driving - safe driving practices were discussed   Plan  Patient Instructions  Set up for home sleep study Healthy sleep regimen Do not drive if sleepy Work on healthy weight loss Follow-up in 6 to 8 weeks to discuss sleep study results and treatment plan

## 2022-06-01 ENCOUNTER — Other Ambulatory Visit: Payer: Self-pay | Admitting: Family Medicine

## 2022-06-08 NOTE — Progress Notes (Signed)
Reviewed and agree with assessment/plan.   Brennan Karam, MD [facility] 06/08/2022, 9:49 AM Pager:  [phone]  

## 2022-06-19 ENCOUNTER — Telehealth: Payer: Self-pay | Admitting: Adult Health

## 2022-06-19 NOTE — Telephone Encounter (Signed)
Fax received from Dr. Teryl Lucy with [facility] to perform a LEFT TOTAL SHOULDER REPLACEMENT on patient.  Patient needs surgery clearance. Surgery is PENDING. Patient was seen on 05/25/2022. Office protocol is a risk assessment can be sent to surgeon if patient has been seen in 60 days or less.   Sending to Rubye Oaks NP  for risk assessment or recommendations if patient needs to be seen in office prior to surgical procedure.

## 2022-06-20 NOTE — Telephone Encounter (Signed)
What am I giving clearance . I just saw for consult . Home sleep study has not been done yet.  Do not have study results. Will need to discuss from PCP for surgical clearance.

## 2022-06-20 NOTE — Telephone Encounter (Signed)
Dr [place],  Rubye Oaks NP states that she just saw patient for sleep consult and the sleep study has not been done yet at this time. So we have no results yet for the home sleep study. She once again wants me to refer this patient back to you and your office for clearance with surgery.   Please advise

## 2022-07-05 ENCOUNTER — Other Ambulatory Visit: Payer: Self-pay | Admitting: Family Medicine

## 2022-07-30 ENCOUNTER — Other Ambulatory Visit: Payer: Self-pay | Admitting: Family Medicine

## 2022-08-09 ENCOUNTER — Other Ambulatory Visit: Payer: Self-pay | Admitting: Family Medicine

## 2022-09-05 ENCOUNTER — Other Ambulatory Visit: Payer: Self-pay | Admitting: Family Medicine

## 2022-09-16 ENCOUNTER — Other Ambulatory Visit: Payer: Self-pay | Admitting: Family Medicine

## 2022-09-22 ENCOUNTER — Ambulatory Visit (INDEPENDENT_AMBULATORY_CARE_PROVIDER_SITE_OTHER): Payer: MEDICARE

## 2022-09-22 DIAGNOSIS — G4733 Obstructive sleep apnea (adult) (pediatric): Secondary | ICD-10-CM

## 2022-10-02 ENCOUNTER — Telehealth: Payer: Self-pay | Admitting: Adult Health

## 2022-10-02 NOTE — Telephone Encounter (Signed)
Closing encounter

## 2022-10-13 ENCOUNTER — Other Ambulatory Visit: Payer: Self-pay | Admitting: Family Medicine

## 2022-10-19 ENCOUNTER — Ambulatory Visit (INDEPENDENT_AMBULATORY_CARE_PROVIDER_SITE_OTHER): Payer: MEDICARE | Admitting: Family Medicine

## 2022-10-19 VITALS — BP 115/83 | HR 87 | Temp 97.0°F | Ht 66.0 in | Wt 180.0 lb

## 2022-10-19 DIAGNOSIS — R198 Other specified symptoms and signs involving the digestive system and abdomen: Secondary | ICD-10-CM | POA: Insufficient documentation

## 2022-10-19 NOTE — Assessment & Plan Note (Signed)
Exam normal. Reassurance provided. Supportive care.

## 2022-10-19 NOTE — Patient Instructions (Signed)
Exam normal.  Call [facility] regarding your results. I can't seem to locate it either.  Take care  Dr. [place]

## 2022-10-19 NOTE — Progress Notes (Signed)
Subjective:  Patient ID: Martin Rodriguez, male    DOB: Mar 11, 1956  Age: 66 y.o. MRN: [id number]  CC: Chief Complaint  Patient presents with   LUQ swelling     X 1 month , retired and has no energy     HPI:  66 year old male presents for evaluation of the above.  1 month history of LUQ swelling/fullness. No pain. No nausea, vomiting. He reports fatigue but states that this is chronic. No fever. No other associated symptoms. No other complaints.   Patient Active Problem List   Diagnosis Date Noted   LUQ fullness 10/19/2022   OSA (obstructive sleep apnea) 05/05/2022   Chronic left shoulder pain 02/21/2022   Essential hypertension, benign 03/17/2020   Anxiety 03/17/2020   HLD (hyperlipidemia) 03/17/2020    Social Hx   Social History   Socioeconomic History   Marital status: Divorced    Spouse name: Not on file   Number of children: Not on file   Years of education: Not on file   Highest education level: Not on file  Occupational History   Occupation: retired    Comment: Railroad  Tobacco Use   Smoking status: Never   Smokeless tobacco: Never  Vaping Use   Vaping Use: Not on file  Substance and Sexual Activity   Alcohol use: Yes    Alcohol/week: 2.0 standard drinks of alcohol    Types: 1 Glasses of wine, 1 Cans of beer per week    Comment: occasional   Drug use: Never   Sexual activity: Not on file  Other Topics Concern   Not on file  Social History Narrative   Single,divorced.Lives with ex-wife.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   Social Determinants of Health   Financial Resource Strain: Not on file  Food Insecurity: Not on file  Transportation Needs: Not on file  Physical Activity: Not on file  Stress: Not on file  Social Connections: Not on file    Review of Systems Per HPI  Objective:  BP 115/83   Pulse 87   Temp (!) 97 F (36.1 C)   Ht 5\' 6"  (1.676 m)   Wt 180 lb (81.6 kg)   SpO2 99%   BMI 29.05  kg/m      10/19/2022    3:18 PM 05/25/2022    3:34 PM 05/05/2022    1:05 PM  BP/Weight  Systolic BP 115 100 129  Diastolic BP 83 70 86  Wt. (Lbs) 180 183.4 183  BMI 29.05 kg/m2 29.6 kg/m2 30.45 kg/m2    Physical Exam Vitals and nursing note reviewed.  Constitutional:      General: He is not in acute distress.    Appearance: Normal appearance.  HENT:     Head: Normocephalic and atraumatic.  Eyes:     General:        Right eye: No discharge.        Left eye: No discharge.     Conjunctiva/sclera: Conjunctivae normal.  Cardiovascular:     Rate and Rhythm: Normal rate and regular rhythm.  Pulmonary:     Effort: Pulmonary effort is normal.     Breath sounds: Normal breath sounds.  Abdominal:     Comments: Soft, nondistended. No appreciable hepatosplenomegaly.   Neurological:     Mental Status: He is alert.     Lab Results  Component Value Date   WBC 7.4 02/21/2022   HGB 14.7 02/21/2022   HCT 42.8 02/21/2022  PLT 239 02/21/2022   GLUCOSE 107 (H) 02/21/2022   CHOL 147 02/21/2022   TRIG 166 (H) 02/21/2022   HDL 46 02/21/2022   LDLCALC 73 02/21/2022   ALT 25 02/21/2022   AST 23 02/21/2022   NA 141 02/21/2022   K 4.1 02/21/2022   CL 102 02/21/2022   CREATININE 1.20 02/21/2022   BUN 21 02/21/2022   CO2 24 02/21/2022   TSH 3.57 02/10/2020   PSA 0.8 02/10/2020   HGBA1C 5.8 (H) 02/21/2022     Assessment & Plan:   Problem List Items Addressed This Visit       Other   LUQ fullness - Primary    Exam normal. Reassurance provided. Supportive care.        Everlene Other DO [facility]

## 2022-10-31 ENCOUNTER — Other Ambulatory Visit: Payer: Self-pay | Admitting: Family Medicine

## 2022-10-31 ENCOUNTER — Telehealth: Payer: Self-pay

## 2022-10-31 NOTE — Telephone Encounter (Signed)
Nurses I do not feel comfortable sending in prednisone without patient being seen recommend office visit with Dr. [place] tomorrow (I am not familiar in great detail with this patient's health and wellbeing so therefore this is not something that I feel comfortable calling in sight unseen)

## 2022-10-31 NOTE — Telephone Encounter (Signed)
Spoke with patient and advised patient per Dr. Lorin Picket I do not feel comfortable sending in prednisone without patient being seen recommend office visit with Dr. [place] tomorrow. Patient verbalized understanding. Patient appointment scheduled.

## 2022-10-31 NOTE — Telephone Encounter (Signed)
Patient is having nasal congestion, denies fever, having body aches, and has had a negative COVID test. Last seen Dr [place] on  10/19/2022. Patient would like a prescription for 5 days of prednisone. Patient states during the winter season he gets this same mess every year and prednisone gets him over the hump. Patient has a history of asthma. The best number to reach him is [phone].)

## 2022-11-01 ENCOUNTER — Encounter: Payer: Self-pay | Admitting: Family Medicine

## 2022-11-01 ENCOUNTER — Ambulatory Visit (INDEPENDENT_AMBULATORY_CARE_PROVIDER_SITE_OTHER): Payer: MEDICARE | Admitting: Family Medicine

## 2022-11-01 VITALS — BP 122/81 | HR 106 | Temp 99.2°F | Ht 66.0 in | Wt 183.0 lb

## 2022-11-01 DIAGNOSIS — J988 Other specified respiratory disorders: Secondary | ICD-10-CM | POA: Insufficient documentation

## 2022-11-01 DIAGNOSIS — R059 Cough, unspecified: Secondary | ICD-10-CM

## 2022-11-01 MED ORDER — PREDNISONE 50 MG PO TABS: ORAL_TABLET | ORAL | 0 refills | Status: DC

## 2022-11-01 MED ORDER — PROMETHAZINE-DM 6.25-15 MG/5ML PO SYRP: 5.0000 mL | ORAL_SOLUTION | Freq: Four times a day (QID) | ORAL | 0 refills | Status: DC | PRN

## 2022-11-01 NOTE — Progress Notes (Signed)
Subjective:  Patient ID: Martin Rodriguez, male    DOB: June 21, 1956  Age: 66 y.o. MRN: [id number]  CC: Chief Complaint  Patient presents with   Cough    Congestion, 4 days - very little production , body aches- taking ibuprofen for joint pain, 2 days ago covid negative    HPI:  66 year old male presents for evaluation the above.  Patient states that he is been sick for the past 4 days.  He reports cough, congestion, body aches.  Low-grade temp. He took a COVID test at home which was negative.  He states that the test was from 2022.  He has been taking ibuprofen for body aches/joint pain.  Also reports severe sore throat.  No reported sick contacts.  No relieving factors.  No other complaints.  Patient Active Problem List   Diagnosis Date Noted   Wheezing-associated respiratory infection (WARI) 11/01/2022   LUQ fullness 10/19/2022   OSA (obstructive sleep apnea) 05/05/2022   Chronic left shoulder pain 02/21/2022   Essential hypertension, benign 03/17/2020   Anxiety 03/17/2020   HLD (hyperlipidemia) 03/17/2020    Social Hx   Social History   Socioeconomic History   Marital status: Divorced    Spouse name: Not on file   Number of children: Not on file   Years of education: Not on file   Highest education level: Not on file  Occupational History   Occupation: retired    Comment: [employer]  Tobacco Use   Smoking status: Never   Smokeless tobacco: Never  Vaping Use   Vaping Use: Not on file  Substance and Sexual Activity   Alcohol use: Yes    Alcohol/week: 2.0 standard drinks of alcohol    Types: 1 Glasses of wine, 1 Cans of beer per week    Comment: occasional   Drug use: Never   Sexual activity: Not on file  Other Topics Concern   Not on file  Social History Narrative   Single,divorced.Lives with ex-wife.Retired from [employer] as Therapist, occupational.Moved from [place] October 2020.   Social Determinants of Health   Financial Resource Strain: Not on file  Food  Insecurity: Not on file  Transportation Needs: Not on file  Physical Activity: Not on file  Stress: Not on file  Social Connections: Not on file    Review of Systems Per HPI  Objective:  BP 122/81   Pulse (!) 106   Temp 99.2 F (37.3 C)   Ht 5\' 6"  (1.676 m)   Wt 183 lb (83 kg)   SpO2 98%   BMI 29.54 kg/m      11/01/2022   10:19 AM 10/19/2022    3:18 PM 05/25/2022    3:34 PM  BP/Weight  Systolic BP [id number]  Diastolic BP 81 83 70  Wt. (Lbs) 183 180 183.4  BMI 29.54 kg/m2 29.05 kg/m2 29.6 kg/m2    Physical Exam Vitals and nursing note reviewed.  Constitutional:      General: He is not in acute distress. HENT:     Head: Normocephalic and atraumatic.     Right Ear: Tympanic membrane normal.     Left Ear: Tympanic membrane normal.     Mouth/Throat:     Pharynx: Posterior oropharyngeal erythema present. No oropharyngeal exudate.  Cardiovascular:     Rate and Rhythm: Regular rhythm. Tachycardia present.  Pulmonary:     Effort: Pulmonary effort is normal.     Comments: Diffuse wheezing. Neurological:     Mental Status: He is alert.  Psychiatric:        Mood and Affect: Mood normal.        Behavior: Behavior normal.     Lab Results  Component Value Date   WBC 7.4 02/21/2022   HGB 14.7 02/21/2022   HCT 42.8 02/21/2022   PLT 239 02/21/2022   GLUCOSE 107 (H) 02/21/2022   CHOL 147 02/21/2022   TRIG 166 (H) 02/21/2022   HDL 46 02/21/2022   LDLCALC 73 02/21/2022   ALT 25 02/21/2022   AST 23 02/21/2022   NA 141 02/21/2022   K 4.1 02/21/2022   CL 102 02/21/2022   CREATININE 1.20 02/21/2022   BUN 21 02/21/2022   CO2 24 02/21/2022   TSH 3.57 02/10/2020   PSA 0.8 02/10/2020   HGBA1C 5.8 (H) 02/21/2022     Assessment & Plan:   Problem List Items Addressed This Visit       Respiratory   Wheezing-associated respiratory infection (WARI)    Awaiting COVID and flu testing. Significant wheezing on exam.  Prednisone as prescribed.  Promethazine DM for  cough.  Supportive care.      Other Visit Diagnoses     Cough, unspecified type    -  Primary   Relevant Orders   COVID-19, Flu A+B and RSV       Meds ordered this encounter  Medications   predniSONE (DELTASONE) 50 MG tablet    Sig: 1 tablet daily x 5 days    Dispense:  5 tablet    Refill:  0   promethazine-dextromethorphan (PROMETHAZINE-DM) 6.25-15 MG/5ML syrup    Sig: Take 5 mLs by mouth 4 (four) times daily as needed.    Dispense:  118 mL    Refill:  0    Follow-up:  Return if symptoms worsen or fail to improve.  Everlene Other DO [facility]

## 2022-11-01 NOTE — Assessment & Plan Note (Signed)
Awaiting COVID and flu testing. Significant wheezing on exam.  Prednisone as prescribed.  Promethazine DM for cough.  Supportive care.

## 2022-11-01 NOTE — Patient Instructions (Signed)
Rest, fluids.  Medications as prescribed.  Awaiting COVID/Flu testing.  Take care  Dr. [place]

## 2022-11-03 ENCOUNTER — Other Ambulatory Visit: Payer: Self-pay | Admitting: Family Medicine

## 2022-11-03 LAB — COVID-19, FLU A+B AND RSV
Influenza A, NAA: DETECTED — AB
Influenza B, NAA: NOT DETECTED
RSV, NAA: NOT DETECTED
SARS-CoV-2, NAA: NOT DETECTED

## 2022-11-13 ENCOUNTER — Other Ambulatory Visit: Payer: Self-pay | Admitting: Family Medicine

## 2022-12-01 ENCOUNTER — Ambulatory Visit (INDEPENDENT_AMBULATORY_CARE_PROVIDER_SITE_OTHER): Payer: MEDICARE

## 2022-12-01 VITALS — BP 108/68 | Ht 66.0 in | Wt 182.0 lb

## 2022-12-01 DIAGNOSIS — Z0000 Encounter for general adult medical examination without abnormal findings: Secondary | ICD-10-CM

## 2022-12-01 NOTE — Patient Instructions (Addendum)
Martin Rodriguez , Thank you for taking time to come for your Medicare Wellness Visit. I appreciate your ongoing commitment to your health goals. Please review the following plan we discussed and let me know if I can assist you in the future.   These are the goals we discussed:  Goals   None     This is a list of the screening recommended for you and due dates:  Health Maintenance  Topic Date Due   Medicare Annual Wellness Visit  Never done   COVID-19 Vaccine (1) Never done   HIV Screening  Never done   Hepatitis C Screening: USPSTF Recommendation to screen - Ages 70-79 yo.  Never done   DTaP/Tdap/Td vaccine (1 - Tdap) Never done   Colon Cancer Screening  Never done   Pneumonia Vaccine (1 - PCV) Never done   Zoster (Shingles) Vaccine (1 of 2) 01/18/2023*   Flu Shot  02/04/2023*   HPV Vaccine  Aged Out  *Topic was postponed. The date shown is not the original due date.    Advanced directives: Advance directive discussed with you today. I have provided a copy for you to complete at home and have notarized. Once this is complete please bring a copy in to our office so we can scan it into your chart.   Conditions/risks identified: Aim for 30 minutes of exercise or brisk walking, 6-8 glasses of water, and 5 servings of fruits and vegetables each day.   Next appointment: Follow up in one year for your annual wellness visit.   Preventive Care 42 Years and Older, Male  Preventive care refers to lifestyle choices and visits with your health care provider that can promote health and wellness. What does preventive care include? A yearly physical exam. This is also called an annual well check. Dental exams once or twice a year. Routine eye exams. Ask your health care provider how often you should have your eyes checked. Personal lifestyle choices, including: Daily care of your teeth and gums. Regular physical activity. Eating a healthy diet. Avoiding tobacco and drug use. Limiting alcohol  use. Practicing safe sex. Taking low doses of aspirin every day. Taking vitamin and mineral supplements as recommended by your health care provider. What happens during an annual well check? The services and screenings done by your health care provider during your annual well check will depend on your age, overall health, lifestyle risk factors, and family history of disease. Counseling  Your health care provider may ask you questions about your: Alcohol use. Tobacco use. Drug use. Emotional well-being. Home and relationship well-being. Sexual activity. Eating habits. History of falls. Memory and ability to understand (cognition). Work and work Statistician. Screening  You may have the following tests or measurements: Height, weight, and BMI. Blood pressure. Lipid and cholesterol levels. These may be checked every 5 years, or more frequently if you are over 63 years old. Skin check. Lung cancer screening. You may have this screening every year starting at age 2 if you have a 30-pack-year history of smoking and currently smoke or have quit within the past 15 years. Fecal occult blood test (FOBT) of the stool. You may have this test every year starting at age 26. Flexible sigmoidoscopy or colonoscopy. You may have a sigmoidoscopy every 5 years or a colonoscopy every 10 years starting at age 46. Prostate cancer screening. Recommendations will vary depending on your family history and other risks. Hepatitis C blood test. Hepatitis B blood test. Sexually transmitted disease (STD)  testing. Diabetes screening. This is done by checking your blood sugar (glucose) after you have not eaten for a while (fasting). You may have this done every 1-3 years. Abdominal aortic aneurysm (AAA) screening. You may need this if you are a current or former smoker. Osteoporosis. You may be screened starting at age 79 if you are at high risk. Talk with your health care provider about your test results,  treatment options, and if necessary, the need for more tests. Vaccines  Your health care provider may recommend certain vaccines, such as: Influenza vaccine. This is recommended every year. Tetanus, diphtheria, and acellular pertussis (Tdap, Td) vaccine. You may need a Td booster every 10 years. Zoster vaccine. You may need this after age 17. Pneumococcal 13-valent conjugate (PCV13) vaccine. One dose is recommended after age 42. Pneumococcal polysaccharide (PPSV23) vaccine. One dose is recommended after age 64. Talk to your health care provider about which screenings and vaccines you need and how often you need them. This information is not intended to replace advice given to you by your health care provider. Make sure you discuss any questions you have with your health care provider. Document Released: 11/19/2015 Document Revised: 07/12/2016 Document Reviewed: 08/24/2015 Elsevier Interactive Patient Education  2017 [place] Prevention in the Home Falls can cause injuries. They can happen to people of all ages. There are many things you can do to make your home safe and to help prevent falls. What can I do on the outside of my home? Regularly fix the edges of walkways and driveways and fix any cracks. Remove anything that might make you trip as you walk through a door, such as a raised step or threshold. Trim any bushes or trees on the path to your home. Use bright outdoor lighting. Clear any walking paths of anything that might make someone trip, such as rocks or tools. Regularly check to see if handrails are loose or broken. Make sure that both sides of any steps have handrails. Any raised decks and porches should have guardrails on the edges. Have any leaves, snow, or ice cleared regularly. Use sand or salt on walking paths during winter. Clean up any spills in your garage right away. This includes oil or grease spills. What can I do in the bathroom? Use night  lights. Install grab bars by the toilet and in the tub and shower. Do not use towel bars as grab bars. Use non-skid mats or decals in the tub or shower. If you need to sit down in the shower, use a plastic, non-slip stool. Keep the floor dry. Clean up any water that spills on the floor as soon as it happens. Remove soap buildup in the tub or shower regularly. Attach bath mats securely with double-sided non-slip rug tape. Do not have throw rugs and other things on the floor that can make you trip. What can I do in the bedroom? Use night lights. Make sure that you have a light by your bed that is easy to reach. Do not use any sheets or blankets that are too big for your bed. They should not hang down onto the floor. Have a firm chair that has side arms. You can use this for support while you get dressed. Do not have throw rugs and other things on the floor that can make you trip. What can I do in the kitchen? Clean up any spills right away. Avoid walking on wet floors. Keep items that you use a lot in  easy-to-reach places. If you need to reach something above you, use a strong step stool that has a grab bar. Keep electrical cords out of the way. Do not use floor polish or wax that makes floors slippery. If you must use wax, use non-skid floor wax. Do not have throw rugs and other things on the floor that can make you trip. What can I do with my stairs? Do not leave any items on the stairs. Make sure that there are handrails on both sides of the stairs and use them. Fix handrails that are broken or loose. Make sure that handrails are as long as the stairways. Check any carpeting to make sure that it is firmly attached to the stairs. Fix any carpet that is loose or worn. Avoid having throw rugs at the top or bottom of the stairs. If you do have throw rugs, attach them to the floor with carpet tape. Make sure that you have a light switch at the top of the stairs and the bottom of the stairs. If  you do not have them, ask someone to add them for you. What else can I do to help prevent falls? Wear shoes that: Do not have high heels. Have rubber bottoms. Are comfortable and fit you well. Are closed at the toe. Do not wear sandals. If you use a stepladder: Make sure that it is fully opened. Do not climb a closed stepladder. Make sure that both sides of the stepladder are locked into place. Ask someone to hold it for you, if possible. Clearly mark and make sure that you can see: Any grab bars or handrails. First and last steps. Where the edge of each step is. Use tools that help you move around (mobility aids) if they are needed. These include: Canes. Walkers. Scooters. Crutches. Turn on the lights when you go into a dark area. Replace any light bulbs as soon as they burn out. Set up your furniture so you have a clear path. Avoid moving your furniture around. If any of your floors are uneven, fix them. If there are any pets around you, be aware of where they are. Review your medicines with your doctor. Some medicines can make you feel dizzy. This can increase your chance of falling. Ask your doctor what other things that you can do to help prevent falls. This information is not intended to replace advice given to you by your health care provider. Make sure you discuss any questions you have with your health care provider. Document Released: 08/19/2009 Document Revised: 03/30/2016 Document Reviewed: 11/27/2014 Elsevier Interactive Patient Education  2017 [employer].

## 2022-12-01 NOTE — Progress Notes (Signed)
Subjective:   Martin Rodriguez is a 67 y.o. male who presents for an Initial Medicare Annual Wellness Visit.  Review of Systems     Cardiac Risk Factors include: advanced age (>41men, >86 women);male gender;hypertension     Objective:    Today's Vitals   12/01/22 1401  BP: 108/68  Weight: 182 lb (82.6 kg)  Height: 5\' 6"  (1.676 m)   Body mass index is 29.38 kg/m.     12/01/2022    3:00 PM  Advanced Directives  Does Patient Have a Medical Advance Directive? No  Would patient like information on creating a medical advance directive? Yes (MAU/Ambulatory/Procedural Areas - Information given)    Current Medications (verified) Outpatient Encounter Medications as of 12/01/2022  Medication Sig   ALPRAZolam (XANAX) 0.5 MG tablet TAKE 1 TABLET BY MOUTH 2 TIMES DAILY AS NEEDED.   aspirin-acetaminophen-caffeine (EXCEDRIN MIGRAINE) 250-250-65 MG tablet Take 2 tablets by mouth every 6 (six) hours as needed for headache.   atorvastatin (LIPITOR) 10 MG tablet TAKE 1 TABLET BY MOUTH EVERY DAY   B Complex Vitamins (B COMPLEX PO) Take by mouth.   Cholecalciferol (VITAMIN D-3) 125 MCG (5000 UT) TABS Take 2 tablets by mouth daily.   citalopram (CELEXA) 20 MG tablet TAKE 1 TABLET BY MOUTH EVERY DAY   lisinopril (ZESTRIL) 20 MG tablet TAKE 1 TABLET BY MOUTH EVERY DAY   loratadine (CLARITIN) 10 MG tablet Take 10 mg by mouth daily as needed.   meloxicam (MOBIC) 15 MG tablet TAKE 1 TABLET BY MOUTH EVERY DAY AS NEEDED FOR PAIN   Multiple Vitamin (MULTIVITAMIN ADULT PO) Take by mouth.   Omeprazole Magnesium (PRILOSEC OTC PO) Take by mouth.   vitamin k 100 MCG tablet Take 100 mcg by mouth daily.   [DISCONTINUED] promethazine-dextromethorphan (PROMETHAZINE-DM) 6.25-15 MG/5ML syrup Take 5 mLs by mouth 4 (four) times daily as needed.   [DISCONTINUED] predniSONE (DELTASONE) 50 MG tablet 1 tablet daily x 5 days   No facility-administered encounter medications on file as of 12/01/2022.    Allergies  (verified) Penicillins and Sulfa antibiotics   History: Past Medical History:  Diagnosis Date   Allergy    seasonal   Anxiety    Asthma    Back pain    COPD (chronic obstructive pulmonary disease) (HCC)    Essential hypertension, benign 03/17/2020   GERD (gastroesophageal reflux disease) 1980's   HLD (hyperlipidemia) 03/17/2020   Prediabetes 03/17/2020   Shoulder pain    Ulcer 1970   Past Surgical History:  Procedure Laterality Date   EYE SURGERY     TONSILECTOMY/ADENOIDECTOMY WITH MYRINGOTOMY     Family History  Problem Relation Age of Onset   Memory loss Mother    COPD Father    Social History   Socioeconomic History   Marital status: Divorced    Spouse name: Not on file   Number of children: Not on file   Years of education: Not on file   Highest education level: Not on file  Occupational History   Occupation: retired    Comment: [employer]  Tobacco Use   Smoking status: Never   Smokeless tobacco: Never  Vaping Use   Vaping Use: Not on file  Substance and Sexual Activity   Alcohol use: Yes    Alcohol/week: 2.0 standard drinks of alcohol    Types: 1 Glasses of wine, 1 Cans of beer per week    Comment: occasional   Drug use: Never   Sexual activity: Not Currently  Birth control/protection: None  Other Topics Concern   Not on file  Social History Narrative   Single,divorced.Lives with ex-wife.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   Social Determinants of Health   Financial Resource Strain: Low Risk  (12/01/2022)   Overall Financial Resource Strain (CARDIA)    Difficulty of Paying Living Expenses: Not hard at all  Food Insecurity: No Food Insecurity (12/01/2022)   Hunger Vital Sign    Worried About Running Out of Food in the Last Year: Never true    Ran Out of Food in the Last Year: Never true  Transportation Needs: No Transportation Needs (12/01/2022)   PRAPARE - Administrator, Civil Service  (Medical): No    Lack of Transportation (Non-Medical): No  Physical Activity: Insufficiently Active (12/01/2022)   Exercise Vital Sign    Days of Exercise per Week: 3 days    Minutes of Exercise per Session: 40 min  Stress: No Stress Concern Present (12/01/2022)   [employer] of Occupational Health - Occupational Stress Questionnaire    Feeling of Stress : Only a little  Social Connections: Socially Isolated (12/01/2022)   Social Connection and Isolation Panel [NHANES]    Frequency of Communication with Friends and Family: Once a week    Frequency of Social Gatherings with Friends and Family: Once a week    Attends Religious Services: Never    Database administrator or Organizations: No    Attends Engineer, structural: Never    Marital Status: Divorced    Tobacco Counseling Counseling given: Not Answered   Clinical Intake:  Pre-visit preparation completed: Yes  Pain : No/denies pain  Diabetes: No  How often do you need to have someone help you when you read instructions, pamphlets, or other written materials from your doctor or pharmacy?: 1 - Never  Diabetic?No   Interpreter Needed?: No  Information entered by :: Kandis Fantasia LPN   Activities of Daily Living    12/01/2022    3:00 PM 11/27/2022   12:02 PM  In your present state of health, do you have any difficulty performing the following activities:  Hearing? 0 0  Vision? 0 0  Difficulty concentrating or making decisions? 0 0  Walking or climbing stairs? 0 0  Dressing or bathing? 0 0  Doing errands, shopping? 0 0  Preparing Food and eating ? N N  Using the Toilet? N N  In the past six months, have you accidently leaked urine? N N  Do you have problems with loss of bowel control? N N  Managing your Medications? N N  Managing your Finances? N N  Housekeeping or managing your Housekeeping? N N    Patient Care Team: Tommie Sams, DO as PCP - General (Family Medicine)  Indicate any recent  Medical Services you may have received from other than [facility] providers in the past year (date may be approximate).     Assessment:   This is a routine wellness examination for Martin Rodriguez.  Hearing/Vision screen Hearing Screening - Comments:: Denies hearing difficulties  Vision Screening - Comments:: Wears rx glasses - will schedule routine eye exam    Dietary issues and exercise activities discussed: Current Exercise Habits: The patient does not participate in regular exercise at present   Goals Addressed   None    Depression Screen    12/01/2022    2:29 PM 10/19/2022    3:22 PM 02/21/2022    9:55 AM 02/10/2020  2:45 PM  PHQ 2/9 Scores  PHQ - 2 Score 0 0 0 0  PHQ- 9 Score  4    Exception Documentation    Medical reason    Fall Risk    12/01/2022    2:25 PM 11/27/2022   12:02 PM 10/19/2022    3:22 PM 02/21/2022    9:53 AM 02/10/2020    2:44 PM  Fall Risk   Falls in the past year? 0 0 0 0 0  Number falls in past yr: 0 0 0 0 0  Injury with Fall? 0 0 0 0 0  Risk for fall due to :   No Fall Risks No Fall Risks No Fall Risks  Follow up Falls prevention discussed;Education provided;Falls evaluation completed  Falls evaluation completed Falls evaluation completed Falls evaluation completed    FALL RISK PREVENTION PERTAINING TO THE HOME:  Any stairs in or around the home? No  If so, are there any without handrails? No  Home free of loose throw rugs in walkways, pet beds, electrical cords, etc? Yes  Adequate lighting in your home to reduce risk of falls? Yes   ASSISTIVE DEVICES UTILIZED TO PREVENT FALLS:  Life alert? No  Use of a cane, walker or w/c? No  Grab bars in the bathroom? Yes  Shower chair or bench in shower? No  Elevated toilet seat or a handicapped toilet? Yes   TIMED UP AND GO:  Was the test performed? Yes .  Length of time to ambulate 10 feet: 5 sec.   Gait steady and fast without use of assistive device  Cognitive Function:        12/01/2022    3:01 PM   6CIT Screen  What Year? 0 points  What month? 0 points  What time? 0 points  Count back from 20 0 points  Months in reverse 0 points  Repeat phrase 0 points  Total Score 0 points    Immunizations  There is no immunization history on file for this patient.  TDAP status: Due, Education has been provided regarding the importance of this vaccine. Advised may receive this vaccine at local pharmacy or Health Dept. Aware to provide a copy of the vaccination record if obtained from local pharmacy or Health Dept. Verbalized acceptance and understanding.  Flu Vaccine status: Declined, Education has been provided regarding the importance of this vaccine but patient still declined. Advised may receive this vaccine at local pharmacy or Health Dept. Aware to provide a copy of the vaccination record if obtained from local pharmacy or Health Dept. Verbalized acceptance and understanding.  Pneumococcal vaccine status: Due, Education has been provided regarding the importance of this vaccine. Advised may receive this vaccine at local pharmacy or Health Dept. Aware to provide a copy of the vaccination record if obtained from local pharmacy or Health Dept. Verbalized acceptance and understanding.  Covid-19 vaccine status: Information provided on how to obtain vaccines.   Qualifies for Shingles Vaccine? Yes   Zostavax completed No   Shingrix Completed?: No.    Education has been provided regarding the importance of this vaccine. Patient has been advised to call insurance company to determine out of pocket expense if they have not yet received this vaccine. Advised may also receive vaccine at local pharmacy or Health Dept. Verbalized acceptance and understanding.  Screening Tests Health Maintenance  Topic Date Due   HIV Screening  Never done   Hepatitis C Screening  Never done   Pneumonia Vaccine 43+ Years old (  1 - PCV) Never done   COVID-19 Vaccine (1) 12/17/2022 (Originally 11/26/1956)   Zoster  Vaccines- Shingrix (1 of 2) 01/18/2023 (Originally 05/27/2007)   INFLUENZA VACCINE  02/04/2023 (Originally 06/06/2022)   COLONOSCOPY (Pts 45-54yrs Insurance coverage will need to be confirmed)  12/02/2023 (Originally 05/26/2002)   Medicare Annual Wellness (AWV)  12/02/2023   HPV VACCINES  Aged Out   DTaP/Tdap/Td  Discontinued    Health Maintenance  Health Maintenance Due  Topic Date Due   HIV Screening  Never done   Hepatitis C Screening  Never done   Pneumonia Vaccine 33+ Years old (1 - PCV) Never done    Colorectal cancer screening:  Patient declines at this time   Lung Cancer Screening: (Low Dose CT Chest recommended if Age 27-80 years, 30 pack-year currently smoking OR have quit w/in 15years.) does not qualify.   Lung Cancer Screening Referral: n/a   Additional Screening:  Hepatitis C Screening: does qualify; Completed at next office visit   Vision Screening: Recommended annual ophthalmology exams for early detection of glaucoma and other disorders of the eye. Is the patient up to date with their annual eye exam?  No  Who is the provider or what is the name of the office in which the patient attends annual eye exams? none If pt is not established with a provider, would they like to be referred to a provider to establish care? No .   Dental Screening: Recommended annual dental exams for proper oral hygiene  Community Resource Referral / Chronic Care Management: CRR required this visit?  No   CCM required this visit?  No      Plan:     I have personally reviewed and noted the following in the patient's chart:   Medical and social history Use of alcohol, tobacco or illicit drugs  Current medications and supplements including opioid prescriptions. Patient is not currently taking opioid prescriptions. Functional ability and status Nutritional status Physical activity Advanced directives List of other physicians Hospitalizations, surgeries, and ER visits in previous 12  months Vitals Screenings to include cognitive, depression, and falls Referrals and appointments  In addition, I have reviewed and discussed with patient certain preventive protocols, quality metrics, and best practice recommendations. A written personalized care plan for preventive services as well as general preventive health recommendations were provided to patient.     Denman George [place], [place]   [id number]   Nurse Notes: No concerns

## 2022-12-19 ENCOUNTER — Ambulatory Visit (INDEPENDENT_AMBULATORY_CARE_PROVIDER_SITE_OTHER): Payer: MEDICARE | Admitting: Family Medicine

## 2022-12-19 VITALS — BP 118/82 | HR 102 | Temp 98.6°F | Ht 66.0 in | Wt 180.0 lb

## 2022-12-19 DIAGNOSIS — B029 Zoster without complications: Secondary | ICD-10-CM | POA: Diagnosis not present

## 2022-12-19 MED ORDER — VALACYCLOVIR HCL 1 G PO TABS: 1000.0000 mg | ORAL_TABLET | Freq: Three times a day (TID) | ORAL | 0 refills | Status: AC

## 2022-12-19 NOTE — Assessment & Plan Note (Signed)
Exam consistent with herpes zoster.  Treating with Valtrex.

## 2022-12-19 NOTE — Progress Notes (Signed)
Subjective:  Patient ID: Martin Rodriguez, male    DOB: 30-Oct-1956  Age: 67 y.o. MRN: [id number]  CC: Chief Complaint  Patient presents with   right leg rash    X 4 days , itches, pain , tired all the time sleep test not resulted    HPI:  67 year old male presents for evaluation of the above.  Patient reports a 4-day history of rash to his right leg.  Started in the medial upper thigh.  Now he has other areas affected on the medial aspect of his leg even on the lower leg.  This is following a dermatomal distribution.  The rash is raised and slightly itchy.  Some discomfort.  He has been applying over-the-counter antibiotic ointment without resolution.  No other associated symptoms.  No other complaints  Patient Active Problem List   Diagnosis Date Noted   Herpes zoster 12/19/2022   OSA (obstructive sleep apnea) 05/05/2022   Chronic left shoulder pain 02/21/2022   Essential hypertension, benign 03/17/2020   Anxiety 03/17/2020   HLD (hyperlipidemia) 03/17/2020    Social Hx   Social History   Socioeconomic History   Marital status: Divorced    Spouse name: Not on file   Number of children: Not on file   Years of education: Not on file   Highest education level: Not on file  Occupational History   Occupation: retired    Comment: [employer]  Tobacco Use   Smoking status: Never   Smokeless tobacco: Never  Vaping Use   Vaping Use: Not on file  Substance and Sexual Activity   Alcohol use: Yes    Alcohol/week: 2.0 standard drinks of alcohol    Types: 1 Glasses of wine, 1 Cans of beer per week    Comment: occasional   Drug use: Never   Sexual activity: Not Currently    Birth control/protection: None  Other Topics Concern   Not on file  Social History Narrative   Single,divorced.Lives with ex-wife.Retired from [employer] as Therapist, occupational.Moved from [place] October 2020.   Social Determinants of Health   Financial Resource Strain: Low Risk  (12/01/2022)   Overall  Financial Resource Strain (CARDIA)    Difficulty of Paying Living Expenses: Not hard at all  Food Insecurity: No Food Insecurity (12/01/2022)   Hunger Vital Sign    Worried About Running Out of Food in the Last Year: Never true    Ran Out of Food in the Last Year: Never true  Transportation Needs: No Transportation Needs (12/01/2022)   PRAPARE - Hydrologist (Medical): No    Lack of Transportation (Non-Medical): No  Physical Activity: Insufficiently Active (12/01/2022)   Exercise Vital Sign    Days of Exercise per Week: 3 days    Minutes of Exercise per Session: 40 min  Stress: No Stress Concern Present (12/01/2022)   [place]    Feeling of Stress : Only a little  Social Connections: Socially Isolated (12/01/2022)   Social Connection and Isolation Panel [NHANES]    Frequency of Communication with Friends and Family: Once a week    Frequency of Social Gatherings with Friends and Family: Once a week    Attends Religious Services: Never    Marine scientist or Organizations: No    Attends Archivist Meetings: Never    Marital Status: Divorced    Review of Systems Per HPI  Objective:  BP 118/82   Pulse Marland Kitchen)  102   Temp 98.6 F (37 C)   Ht 5' 6"$  (1.676 m)   Wt 180 lb (81.6 kg)   SpO2 97%   BMI 29.05 kg/m      12/19/2022    4:07 PM 12/19/2022    4:00 PM 12/01/2022    2:01 PM  BP/Weight  Systolic BP 123456 [id number]  Diastolic BP 82 90 68  Wt. (Lbs)  180 182  BMI  29.05 kg/m2 29.38 kg/m2    Physical Exam Vitals and nursing note reviewed.  Constitutional:      General: He is not in acute distress. HENT:     Head: Normocephalic and atraumatic.  Pulmonary:     Effort: Pulmonary effort is normal. No respiratory distress.  Skin:    Comments: Scattered areas of vesicular rash on the medial aspect of the left thigh and left lower leg.  This is consistent with herpes zoster.   Neurological:     Mental Status: He is alert.     Lab Results  Component Value Date   WBC 7.4 02/21/2022   HGB 14.7 02/21/2022   HCT 42.8 02/21/2022   PLT 239 02/21/2022   GLUCOSE 107 (H) 02/21/2022   CHOL 147 02/21/2022   TRIG 166 (H) 02/21/2022   HDL 46 02/21/2022   LDLCALC 73 02/21/2022   ALT 25 02/21/2022   AST 23 02/21/2022   NA 141 02/21/2022   K 4.1 02/21/2022   CL 102 02/21/2022   CREATININE 1.20 02/21/2022   BUN 21 02/21/2022   CO2 24 02/21/2022   TSH 3.57 02/10/2020   PSA 0.8 02/10/2020   HGBA1C 5.8 (H) 02/21/2022     Assessment & Plan:   Problem List Items Addressed This Visit       Other   Herpes zoster - Primary    Exam consistent with herpes zoster.  Treating with Valtrex.      Relevant Medications   valACYclovir (VALTREX) 1000 MG tablet    Meds ordered this encounter  Medications   valACYclovir (VALTREX) 1000 MG tablet    Sig: Take 1 tablet (1,000 mg total) by mouth 3 (three) times daily for 10 days.    Dispense:  30 tablet    Refill:  0    Follow-up:  Return if symptoms worsen or fail to improve.  [place]

## 2022-12-19 NOTE — Patient Instructions (Signed)
Antiviral as prescribed.  Take care  Dr. Lacinda Axon

## 2022-12-30 ENCOUNTER — Other Ambulatory Visit: Payer: Self-pay | Admitting: Family Medicine

## 2023-01-16 ENCOUNTER — Other Ambulatory Visit: Payer: Self-pay | Admitting: Family Medicine

## 2023-01-26 ENCOUNTER — Other Ambulatory Visit: Payer: Self-pay | Admitting: Family Medicine

## 2023-01-29 ENCOUNTER — Other Ambulatory Visit: Payer: Self-pay | Admitting: Family Medicine

## 2023-02-05 ENCOUNTER — Other Ambulatory Visit: Payer: Self-pay | Admitting: Family Medicine

## 2023-02-15 ENCOUNTER — Other Ambulatory Visit: Payer: Self-pay | Admitting: Family Medicine

## 2023-03-25 ENCOUNTER — Other Ambulatory Visit: Payer: Self-pay | Admitting: Family Medicine

## 2023-04-27 ENCOUNTER — Other Ambulatory Visit: Payer: Self-pay | Admitting: Family Medicine

## 2023-05-10 ENCOUNTER — Other Ambulatory Visit: Payer: Self-pay | Admitting: Family Medicine

## 2023-05-24 ENCOUNTER — Other Ambulatory Visit: Payer: Self-pay | Admitting: Family Medicine

## 2023-05-29 ENCOUNTER — Ambulatory Visit (INDEPENDENT_AMBULATORY_CARE_PROVIDER_SITE_OTHER): Payer: MEDICARE | Admitting: Family Medicine

## 2023-05-29 VITALS — BP 108/74 | HR 88 | Temp 98.2°F | Ht 66.0 in | Wt 182.4 lb

## 2023-05-29 DIAGNOSIS — R300 Dysuria: Secondary | ICD-10-CM | POA: Diagnosis not present

## 2023-05-29 DIAGNOSIS — H5319 Other subjective visual disturbances: Secondary | ICD-10-CM | POA: Diagnosis not present

## 2023-05-29 DIAGNOSIS — N200 Calculus of kidney: Secondary | ICD-10-CM

## 2023-05-29 LAB — POCT URINALYSIS DIP (CLINITEK)
Bilirubin, UA: NEGATIVE
Glucose, UA: NEGATIVE mg/dL
Ketones, POC UA: NEGATIVE mg/dL
Leukocytes, UA: NEGATIVE
Nitrite, UA: NEGATIVE
Spec Grav, UA: >=1.03 — AB (ref 1.010–1.025)
Urobilinogen, UA: 0.2 E.U./dL
pH, UA: 6 (ref 5.0–8.0)

## 2023-05-29 NOTE — Assessment & Plan Note (Signed)
Patient's parents and flashes of light in the left eye.  Associated dizziness.  Referring to ophthalmology.

## 2023-05-29 NOTE — Assessment & Plan Note (Addendum)
Patient has recently taken Azo.  Urine dipstick therefore unreliable.  Sending culture.  He is currently pain-free.  Discussed CT imaging.  Patient does not desire at this time.  Advised to drink at least 3 L of water a day.  Discussed dietary recommendations.

## 2023-05-29 NOTE — Progress Notes (Signed)
Subjective:  Patient ID: Martin Rodriguez, male    DOB: 1956/02/08  Age: 67 y.o. MRN: [id number]  CC:  Lower abdominal pain   HPI:  67 year old male presents for evaluation of the above.  Patient reports that for 5 days ago he developed lower abdominal pain.  Associated dysuria.  He subsequently passed several small stones.  He has a history of kidney stones.  This pain is now resolved.  He is concerned about the possibility of urinary tract infection.  He has taken Azo.  No flank pain or abdominal pain at this time.  Patient also reports that he has had ongoing dizziness with associated flashes of light in his left eye.  He is concerned about this.   Patient Active Problem List   Diagnosis Date Noted   Kidney stone 05/29/2023   Photopsia of right eye 05/29/2023   OSA (obstructive sleep apnea) 05/05/2022   Chronic left shoulder pain 02/21/2022   Essential hypertension, benign 03/17/2020   Anxiety 03/17/2020   HLD (hyperlipidemia) 03/17/2020    Social Hx   Social History   Socioeconomic History   Marital status: Divorced    Spouse name: Not on file   Number of children: Not on file   Years of education: Not on file   Highest education level: Not on file  Occupational History   Occupation: retired    Comment: [employer]  Tobacco Use   Smoking status: Never   Smokeless tobacco: Never  Vaping Use   Vaping status: Not on file  Substance and Sexual Activity   Alcohol use: Yes    Alcohol/week: 2.0 standard drinks of alcohol    Types: 1 Glasses of wine, 1 Cans of beer per week    Comment: occasional   Drug use: Never   Sexual activity: Not Currently    Birth control/protection: None  Other Topics Concern   Not on file  Social History Narrative   Single,divorced.Lives with ex-wife.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   Social Determinants of Health   Financial Resource Strain: Low Risk  (12/01/2022)   Overall Financial Resource  Strain (CARDIA)    Difficulty of Paying Living Expenses: Not hard at all  Food Insecurity: No Food Insecurity (12/01/2022)   Hunger Vital Sign    Worried About Running Out of Food in the Last Year: Never true    Ran Out of Food in the Last Year: Never true  Transportation Needs: No Transportation Needs (12/01/2022)   PRAPARE - Administrator, Civil Service (Medical): No    Lack of Transportation (Non-Medical): No  Physical Activity: Insufficiently Active (12/01/2022)   Exercise Vital Sign    Days of Exercise per Week: 3 days    Minutes of Exercise per Session: 40 min  Stress: No Stress Concern Present (12/01/2022)   [employer] of Occupational Health - Occupational Stress Questionnaire    Feeling of Stress : Only a little  Social Connections: Socially Isolated (12/01/2022)   Social Connection and Isolation Panel [NHANES]    Frequency of Communication with Friends and Family: Once a week    Frequency of Social Gatherings with Friends and Family: Once a week    Attends Religious Services: Never    Database administrator or Organizations: No    Attends Banker Meetings: Never    Marital Status: Divorced    Review of Systems Per HPI  Objective:  BP 108/74   Pulse 88   Temp 98.2  F (36.8 C)   Ht 5\' 6"  (1.676 m)   Wt 182 lb 6.4 oz (82.7 kg)   SpO2 99%   BMI 29.44 kg/m      05/29/2023    4:04 PM 12/19/2022    4:07 PM 12/19/2022    4:00 PM  BP/Weight  Systolic BP 108 118 120  Diastolic BP 74 82 90  Wt. (Lbs) 182.4  180  BMI 29.44 kg/m2  29.05 kg/m2    Physical Exam Constitutional:      General: He is not in acute distress.    Appearance: Normal appearance.  HENT:     Head: Normocephalic and atraumatic.  Cardiovascular:     Rate and Rhythm: Normal rate and regular rhythm.  Pulmonary:     Effort: Pulmonary effort is normal.     Breath sounds: Normal breath sounds.  Neurological:     Mental Status: He is alert.  Psychiatric:        Mood  and Affect: Mood normal.        Behavior: Behavior normal.     Lab Results  Component Value Date   WBC 7.4 02/21/2022   HGB 14.7 02/21/2022   HCT 42.8 02/21/2022   PLT 239 02/21/2022   GLUCOSE 107 (H) 02/21/2022   CHOL 147 02/21/2022   TRIG 166 (H) 02/21/2022   HDL 46 02/21/2022   LDLCALC 73 02/21/2022   ALT 25 02/21/2022   AST 23 02/21/2022   NA 141 02/21/2022   K 4.1 02/21/2022   CL 102 02/21/2022   CREATININE 1.20 02/21/2022   BUN 21 02/21/2022   CO2 24 02/21/2022   TSH 3.57 02/10/2020   PSA 0.8 02/10/2020   HGBA1C 5.8 (H) 02/21/2022     Assessment & Plan:   Problem List Items Addressed This Visit       Genitourinary   Kidney stone - Primary    Patient has recently taken Azo.  Urine dipstick therefore unreliable.  Sending culture.  He is currently pain-free.  Discussed CT imaging.  Patient does not desire at this time.  Advised to drink at least 3 L of water a day.  Discussed dietary recommendations.        Other   Photopsia of right eye    Patient's parents and flashes of light in the left eye.  Associated dizziness.  Referring to ophthalmology.      Relevant Orders   Ambulatory referral to Ophthalmology   Other Visit Diagnoses     Dysuria       Relevant Orders   POCT URINALYSIS DIP (CLINITEK) (Completed)   Urine Culture      Everlene Other DO [facility]

## 2023-05-31 LAB — URINE CULTURE: Organism ID, Bacteria: NO GROWTH

## 2023-06-21 ENCOUNTER — Other Ambulatory Visit: Payer: Self-pay | Admitting: Family Medicine

## 2023-06-25 ENCOUNTER — Encounter: Payer: Self-pay | Admitting: Family Medicine

## 2023-06-25 ENCOUNTER — Ambulatory Visit (INDEPENDENT_AMBULATORY_CARE_PROVIDER_SITE_OTHER): Payer: MEDICARE | Admitting: Family Medicine

## 2023-06-25 VITALS — BP 135/87 | HR 90 | Temp 98.2°F | Wt 180.0 lb

## 2023-06-25 DIAGNOSIS — U071 COVID-19: Secondary | ICD-10-CM | POA: Diagnosis not present

## 2023-06-25 MED ORDER — PREDNISONE 50 MG PO TABS: 50.0000 mg | ORAL_TABLET | Freq: Every day | ORAL | 0 refills | Status: AC

## 2023-06-25 NOTE — Patient Instructions (Signed)
If you worsen, please let me know.  Stay hydrated.  Take care  Dr. Adriana Simas

## 2023-06-25 NOTE — Progress Notes (Signed)
Subjective:  Patient ID: Martin Rodriguez, male    DOB: September 07, 1956  Age: 67 y.o. MRN: [id number]  CC: Respiratory symptoms   HPI:  67 year old male presents for evaluation of the above.  Patient reports that he has been sick for the past 4 to 5 days.  Reports congestion and cough.  No documented fever.  Had a positive COVID test last night.  He states that overall he is doing okay.  He states he is bothered by the cough.  No shortness of breath.  No chest pain.  Patient Active Problem List   Diagnosis Date Noted   COVID 06/25/2023   Kidney stone 05/29/2023   Photopsia of right eye 05/29/2023   OSA (obstructive sleep apnea) 05/05/2022   Chronic left shoulder pain 02/21/2022   Essential hypertension, benign 03/17/2020   Anxiety 03/17/2020   HLD (hyperlipidemia) 03/17/2020    Social Hx   Social History   Socioeconomic History   Marital status: Divorced    Spouse name: Not on file   Number of children: Not on file   Years of education: Not on file   Highest education level: Not on file  Occupational History   Occupation: retired    Comment: [employer]  Tobacco Use   Smoking status: Never   Smokeless tobacco: Never  Vaping Use   Vaping status: Not on file  Substance and Sexual Activity   Alcohol use: Yes    Alcohol/week: 2.0 standard drinks of alcohol    Types: 1 Glasses of wine, 1 Cans of beer per week    Comment: occasional   Drug use: Never   Sexual activity: Not Currently    Birth control/protection: None  Other Topics Concern   Not on file  Social History Narrative   Single,divorced.Lives with ex-wife.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   Social Determinants of Health   Financial Resource Strain: Low Risk  (12/01/2022)   Overall Financial Resource Strain (CARDIA)    Difficulty of Paying Living Expenses: Not hard at all  Food Insecurity: No Food Insecurity (12/01/2022)   Hunger Vital Sign    Worried About Running Out  of Food in the Last Year: Never true    Ran Out of Food in the Last Year: Never true  Transportation Needs: No Transportation Needs (12/01/2022)   PRAPARE - Administrator, Civil Service (Medical): No    Lack of Transportation (Non-Medical): No  Physical Activity: Insufficiently Active (12/01/2022)   Exercise Vital Sign    Days of Exercise per Week: 3 days    Minutes of Exercise per Session: 40 min  Stress: No Stress Concern Present (12/01/2022)   [employer] of Occupational Health - Occupational Stress Questionnaire    Feeling of Stress : Only a little  Social Connections: Socially Isolated (12/01/2022)   Social Connection and Isolation Panel [NHANES]    Frequency of Communication with Friends and Family: Once a week    Frequency of Social Gatherings with Friends and Family: Once a week    Attends Religious Services: Never    Database administrator or Organizations: No    Attends Engineer, structural: Never    Marital Status: Divorced    Review of Systems Per HPI  Objective:  BP 135/87   Pulse 90   Temp 98.2 F (36.8 C)   Wt 180 lb (81.6 kg)   SpO2 90%   BMI 29.05 kg/m      06/25/2023  3:20 PM 05/29/2023    4:04 PM 12/19/2022    4:07 PM  BP/Weight  Systolic BP 135 108 118  Diastolic BP 87 74 82  Wt. (Lbs) 180 182.4   BMI 29.05 kg/m2 29.44 kg/m2     Physical Exam Vitals and nursing note reviewed.  Constitutional:      General: He is not in acute distress.    Appearance: Normal appearance.  HENT:     Head: Normocephalic and atraumatic.  Cardiovascular:     Rate and Rhythm: Normal rate and regular rhythm.  Pulmonary:     Effort: Pulmonary effort is normal.     Breath sounds: Normal breath sounds. No wheezing, rhonchi or rales.  Neurological:     Mental Status: He is alert.     Lab Results  Component Value Date   WBC 7.4 02/21/2022   HGB 14.7 02/21/2022   HCT 42.8 02/21/2022   PLT 239 02/21/2022   GLUCOSE 107 (H) 02/21/2022    CHOL 147 02/21/2022   TRIG 166 (H) 02/21/2022   HDL 46 02/21/2022   LDLCALC 73 02/21/2022   ALT 25 02/21/2022   AST 23 02/21/2022   NA 141 02/21/2022   K 4.1 02/21/2022   CL 102 02/21/2022   CREATININE 1.20 02/21/2022   BUN 21 02/21/2022   CO2 24 02/21/2022   TSH 3.57 02/10/2020   PSA 0.8 02/10/2020   HGBA1C 5.8 (H) 02/21/2022     Assessment & Plan:   Problem List Items Addressed This Visit       Other   COVID - Primary    Positive home test.  He has been sick for 4 to 5 days.  Discussed antiviral medication.  Patient declined.  Placing on prednisone.  Patient states that this often helps him with cough and respiratory symptoms.  Close monitoring.  Supportive care.       Meds ordered this encounter  Medications   predniSONE (DELTASONE) 50 MG tablet    Sig: Take 1 tablet (50 mg total) by mouth daily for 5 days.    Dispense:  5 tablet    Refill:  0    Follow-up:  Return if symptoms worsen or fail to improve.  Everlene Other DO [facility]

## 2023-06-25 NOTE — Assessment & Plan Note (Signed)
Positive home test.  He has been sick for 4 to 5 days.  Discussed antiviral medication.  Patient declined.  Placing on prednisone.  Patient states that this often helps him with cough and respiratory symptoms.  Close monitoring.  Supportive care.

## 2023-07-01 ENCOUNTER — Other Ambulatory Visit: Payer: Self-pay | Admitting: Family Medicine

## 2023-07-02 ENCOUNTER — Other Ambulatory Visit: Payer: Self-pay | Admitting: Family Medicine

## 2023-07-03 ENCOUNTER — Other Ambulatory Visit: Payer: Self-pay | Admitting: Family Medicine

## 2023-07-05 MED ORDER — ALPRAZOLAM 0.5 MG PO TABS: 0.5000 mg | ORAL_TABLET | Freq: Two times a day (BID) | ORAL | 3 refills | Status: DC | PRN

## 2023-07-05 MED ORDER — MELOXICAM 15 MG PO TABS: 15.0000 mg | ORAL_TABLET | Freq: Every day | ORAL | 1 refills | Status: DC | PRN

## 2023-07-06 ENCOUNTER — Other Ambulatory Visit: Payer: Self-pay | Admitting: Nurse Practitioner

## 2023-07-06 ENCOUNTER — Telehealth: Payer: Self-pay | Admitting: Family Medicine

## 2023-07-06 MED ORDER — PREDNISONE 20 MG PO TABS: ORAL_TABLET | ORAL | 0 refills | Status: DC

## 2023-07-06 NOTE — Telephone Encounter (Signed)
Refill on prednisone still has cough and congestion. Send to [facility] [place]

## 2023-07-06 NOTE — Telephone Encounter (Signed)
Campbell Riches, NP   Sent in. Have him follow up next week if symptoms persist. Go to ED or UC this weekend if worse. Thanks.

## 2023-07-06 NOTE — Telephone Encounter (Signed)
Left message to return call 

## 2023-07-26 ENCOUNTER — Other Ambulatory Visit: Payer: Self-pay | Admitting: Family Medicine

## 2023-07-27 ENCOUNTER — Other Ambulatory Visit: Payer: Self-pay | Admitting: Family Medicine

## 2023-08-09 ENCOUNTER — Ambulatory Visit (HOSPITAL_COMMUNITY)
Admission: RE | Admit: 2023-08-09 | Discharge: 2023-08-09 | Disposition: A | Discharge status: Home / Self Care | Payer: Medicare Other | Source: Ambulatory Visit | Attending: Family Medicine | Admitting: Family Medicine

## 2023-08-09 ENCOUNTER — Ambulatory Visit (INDEPENDENT_AMBULATORY_CARE_PROVIDER_SITE_OTHER): Payer: MEDICARE | Admitting: Family Medicine

## 2023-08-09 ENCOUNTER — Encounter: Payer: Self-pay | Admitting: Family Medicine

## 2023-08-09 VITALS — BP 104/69 | HR 94 | Temp 98.9°F | Ht 66.0 in | Wt 183.0 lb

## 2023-08-09 DIAGNOSIS — R053 Chronic cough: Secondary | ICD-10-CM | POA: Insufficient documentation

## 2023-08-09 MED ORDER — BUDESONIDE-FORMOTEROL FUMARATE 160-4.5 MCG/ACT IN AERO: 2.0000 | INHALATION_SPRAY | Freq: Two times a day (BID) | RESPIRATORY_TRACT | 3 refills | Status: DC

## 2023-08-09 MED ORDER — PREDNISONE 10 MG PO TABS: ORAL_TABLET | ORAL | 0 refills | Status: DC

## 2023-08-09 NOTE — Progress Notes (Signed)
Subjective:  Patient ID: Martin Rodriguez, male    DOB: September 26, 1956  Age: 67 y.o. MRN: [id number]  CC: Chief Complaint  Patient presents with   Cough    Covid 1.5 months ago non- productive cough- hx of asthma and copd, polyps in upper chest Request for symbicort, albuterol and chest xraty    HPI:  67 year old male presents with persistent cough.  Had COVID recently (seen on 8/19). Has continued to have cough. Had improvement with prednisone but then cough recurred. No fever.  Nonproductive. No other reported complaints.   Patient Active Problem List   Diagnosis Date Noted   Chronic cough 08/09/2023   COVID 06/25/2023   Kidney stone 05/29/2023   Photopsia of right eye 05/29/2023   OSA (obstructive sleep apnea) 05/05/2022   Chronic left shoulder pain 02/21/2022   Essential hypertension, benign 03/17/2020   Anxiety 03/17/2020   HLD (hyperlipidemia) 03/17/2020    Social Hx   Social History   Socioeconomic History   Marital status: Divorced    Spouse name: Not on file   Number of children: Not on file   Years of education: Not on file   Highest education level: Not on file  Occupational History   Occupation: retired    Comment: [employer]  Tobacco Use   Smoking status: Never   Smokeless tobacco: Never  Vaping Use   Vaping status: Not on file  Substance and Sexual Activity   Alcohol use: Yes    Alcohol/week: 2.0 standard drinks of alcohol    Types: 1 Glasses of wine, 1 Cans of beer per week    Comment: occasional   Drug use: Never   Sexual activity: Not Currently    Birth control/protection: None  Other Topics Concern   Not on file  Social History Narrative   Single,divorced.Lives with ex-wife.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   Social Determinants of Health   Financial Resource Strain: Low Risk  (12/01/2022)   Overall Financial Resource Strain (CARDIA)    Difficulty of Paying Living Expenses: Not hard at all  Food  Insecurity: No Food Insecurity (12/01/2022)   Hunger Vital Sign    Worried About Running Out of Food in the Last Year: Never true    Ran Out of Food in the Last Year: Never true  Transportation Needs: No Transportation Needs (12/01/2022)   PRAPARE - Administrator, Civil Service (Medical): No    Lack of Transportation (Non-Medical): No  Physical Activity: Insufficiently Active (12/01/2022)   Exercise Vital Sign    Days of Exercise per Week: 3 days    Minutes of Exercise per Session: 40 min  Stress: No Stress Concern Present (12/01/2022)   [employer] of Occupational Health - Occupational Stress Questionnaire    Feeling of Stress : Only a little  Social Connections: Socially Isolated (12/01/2022)   Social Connection and Isolation Panel [NHANES]    Frequency of Communication with Friends and Family: Once a week    Frequency of Social Gatherings with Friends and Family: Once a week    Attends Religious Services: Never    Database administrator or Organizations: No    Attends Engineer, structural: Never    Marital Status: Divorced    Review of Systems Per HPI  Objective:  BP 104/69   Pulse 94   Temp 98.9 F (37.2 C)   Ht 5\' 6"  (1.676 m)   Wt 183 lb (83 kg)  SpO2 99%   BMI 29.54 kg/m      08/09/2023   11:39 AM 06/25/2023    3:20 PM 05/29/2023    4:04 PM  BP/Weight  Systolic BP 104 135 108  Diastolic BP 69 87 74  Wt. (Lbs) 183 180 182.4  BMI 29.54 kg/m2 29.05 kg/m2 29.44 kg/m2    Physical Exam Vitals and nursing note reviewed.  Constitutional:      General: He is not in acute distress.    Appearance: Normal appearance.  HENT:     Head: Normocephalic and atraumatic.  Cardiovascular:     Rate and Rhythm: Normal rate and regular rhythm.  Pulmonary:     Effort: Pulmonary effort is normal.     Breath sounds: Normal breath sounds. No wheezing or rales.  Neurological:     Mental Status: He is alert.     Lab Results  Component Value Date    WBC 7.4 02/21/2022   HGB 14.7 02/21/2022   HCT 42.8 02/21/2022   PLT 239 02/21/2022   GLUCOSE 107 (H) 02/21/2022   CHOL 147 02/21/2022   TRIG 166 (H) 02/21/2022   HDL 46 02/21/2022   LDLCALC 73 02/21/2022   ALT 25 02/21/2022   AST 23 02/21/2022   NA 141 02/21/2022   K 4.1 02/21/2022   CL 102 02/21/2022   CREATININE 1.20 02/21/2022   BUN 21 02/21/2022   CO2 24 02/21/2022   TSH 3.57 02/10/2020   PSA 0.8 02/10/2020   HGBA1C 5.8 (H) 02/21/2022     Assessment & Plan:   Problem List Items Addressed This Visit       Other   Chronic cough - Primary    Chest xray obtained and independently reviewed by me. Interpretation: No evidence if infiltrate. Prednisone as prescribed. Starting on Symbicort.      Relevant Orders   DG Chest 2 View (Completed)    Meds ordered this encounter  Medications   predniSONE (DELTASONE) 10 MG tablet    Sig: 50 mg daily x 2 days, then 40 mg daily x 2 days, then 30 mg daily x 2 days, then 20 mg daily x 2 days, then 10 mg daily x 2 days.    Dispense:  30 tablet    Refill:  0   budesonide-formoterol (SYMBICORT) 160-4.5 MCG/ACT inhaler    Sig: Inhale 2 puffs into the lungs 2 (two) times daily.    Dispense:  1 each    Refill:  3    Franky Reier DO [facility]

## 2023-08-09 NOTE — Assessment & Plan Note (Signed)
Chest xray obtained and independently reviewed by me. Interpretation: No evidence if infiltrate. Prednisone as prescribed. Starting on Symbicort.

## 2023-08-09 NOTE — Patient Instructions (Signed)
Xray today.  Prednisone as prescribed. Would not use this again in the near future.  Take care  Dr. Adriana Simas

## 2023-08-10 ENCOUNTER — Other Ambulatory Visit: Payer: Self-pay | Admitting: Family Medicine

## 2023-08-13 MED ORDER — DULERA 100-5 MCG/ACT IN AERO: 2.0000 | INHALATION_SPRAY | Freq: Two times a day (BID) | RESPIRATORY_TRACT | 0 refills | Status: AC

## 2023-09-22 ENCOUNTER — Other Ambulatory Visit: Payer: Self-pay | Admitting: Family Medicine

## 2023-11-01 ENCOUNTER — Other Ambulatory Visit: Payer: Self-pay | Admitting: Family Medicine

## 2023-11-01 MED ORDER — CITALOPRAM HYDROBROMIDE 20 MG PO TABS: 20.0000 mg | ORAL_TABLET | Freq: Every day | ORAL | 0 refills | Status: DC

## 2023-11-13 ENCOUNTER — Other Ambulatory Visit: Payer: Self-pay | Admitting: Family Medicine

## 2023-11-18 ENCOUNTER — Other Ambulatory Visit: Payer: Self-pay | Admitting: Family Medicine

## 2023-12-07 ENCOUNTER — Ambulatory Visit (INDEPENDENT_AMBULATORY_CARE_PROVIDER_SITE_OTHER): Payer: MEDICARE

## 2023-12-07 VITALS — Ht 67.0 in | Wt 168.0 lb

## 2023-12-07 DIAGNOSIS — Z2821 Immunization not carried out because of patient refusal: Secondary | ICD-10-CM | POA: Diagnosis not present

## 2023-12-07 DIAGNOSIS — Z5320 Procedure and treatment not carried out because of patient's decision for unspecified reasons: Secondary | ICD-10-CM | POA: Diagnosis not present

## 2023-12-07 DIAGNOSIS — Z0000 Encounter for general adult medical examination without abnormal findings: Secondary | ICD-10-CM | POA: Diagnosis not present

## 2023-12-07 NOTE — Patient Instructions (Signed)
Mr. Colasanti , Thank you for taking time to come for your Medicare Wellness Visit. I appreciate your ongoing commitment to your health goals. Please review the following plan we discussed and let me know if I can assist you in the future.   Referrals/Orders/Follow-Ups/Clinician Recommendations:  Next Medicare Annual Wellness Visit:  December 19, 2024 at 8:40 am virtual  Vaccinations: declines all Influenza vaccine: recommend every Fall Pneumococcal vaccine: recommend once per lifetime Prevnar-20 Tdap vaccine: recommend every 10 years Shingles vaccine: recommend Shingrix which is 2 doses 2-6 months apart and over 90% effective     Covid-19: recommend 2 doses one month apart with a booster 6 months later   This is a list of the screening recommended for you and due dates:  Health Maintenance  Topic Date Due   Hepatitis C Screening  Never done   Colon Cancer Screening  Never done   Zoster (Shingles) Vaccine (1 of 2) Never done   Pneumonia Vaccine (1 of 1 - PCV) Never done   COVID-19 Vaccine (1 - 2024-25 season) Never done   Flu Shot  02/04/2024*   Medicare Annual Wellness Visit  12/06/2024   HPV Vaccine  Aged Out   DTaP/Tdap/Td vaccine  Discontinued  *Topic was postponed. The date shown is not the original due date.    Advanced directives: (Declined) Advance directive discussed with you today. Even though you declined this today, please call our office should you change your mind, and we can give you the proper paperwork for you to fill out.  Next Medicare Annual Wellness Visit scheduled for next year: yes  Preventive Care 67 Years and Older, Male Preventive care refers to lifestyle choices and visits with your health care provider that can promote health and wellness. Preventive care visits are also called wellness exams. What can I expect for my preventive care visit? Counseling During your preventive care visit, your health care provider may ask about your: Medical history,  including: Past medical problems. Family medical history. History of falls. Current health, including: Emotional well-being. Home life and relationship well-being. Sexual activity. Memory and ability to understand (cognition). Lifestyle, including: Alcohol, nicotine or tobacco, and drug use. Access to firearms. Diet, exercise, and sleep habits. Work and work Astronomer. Sunscreen use. Safety issues such as seatbelt and bike helmet use. Physical exam Your health care provider will check your: Height and weight. These may be used to calculate your BMI (body mass index). BMI is a measurement that tells if you are at a healthy weight. Waist circumference. This measures the distance around your waistline. This measurement also tells if you are at a healthy weight and may help predict your risk of certain diseases, such as type 2 diabetes and high blood pressure. Heart rate and blood pressure. Body temperature. Skin for abnormal spots. What immunizations do I need?  Vaccines are usually given at various ages, according to a schedule. Your health care provider will recommend vaccines for you based on your age, medical history, and lifestyle or other factors, such as travel or where you work. What tests do I need? Screening Your health care provider may recommend screening tests for certain conditions. This may include: Lipid and cholesterol levels. Diabetes screening. This is done by checking your blood sugar (glucose) after you have not eaten for a while (fasting). Hepatitis C test. Hepatitis B test. HIV (human immunodeficiency virus) test. STI (sexually transmitted infection) testing, if you are at risk. Lung cancer screening. Colorectal cancer screening. Prostate cancer screening. Abdominal  aortic aneurysm (AAA) screening. You may need this if you are a current or former smoker. Talk with your health care provider about your test results, treatment options, and if necessary, the  need for more tests. Follow these instructions at home: Eating and drinking  Eat a diet that includes fresh fruits and vegetables, whole grains, lean protein, and low-fat dairy products. Limit your intake of foods with high amounts of sugar, saturated fats, and salt. Take vitamin and mineral supplements as recommended by your health care provider. Do not drink alcohol if your health care provider tells you not to drink. If you drink alcohol: Limit how much you have to 0-2 drinks a day. Know how much alcohol is in your drink. In the [place] one drink equals one 12 oz bottle of beer (355 mL), one 5 oz glass of wine (148 mL), or one 1 oz glass of hard liquor (44 mL). Lifestyle Brush your teeth every morning and night with fluoride toothpaste. Floss one time each day. Exercise for at least 30 minutes 5 or more days each week. Do not use any products that contain nicotine or tobacco. These products include cigarettes, chewing tobacco, and vaping devices, such as e-cigarettes. If you need help quitting, ask your health care provider. Do not use drugs. If you are sexually active, practice safe sex. Use a condom or other form of protection to prevent STIs. Take aspirin only as told by your health care provider. Make sure that you understand how much to take and what form to take. Work with your health care provider to find out whether it is safe and beneficial for you to take aspirin daily. Ask your health care provider if you need to take a cholesterol-lowering medicine (statin). Find healthy ways to manage stress, such as: Meditation, yoga, or listening to music. Journaling. Talking to a trusted person. Spending time with friends and family. Safety Always wear your seat belt while driving or riding in a vehicle. Do not drive: If you have been drinking alcohol. Do not ride with someone who has been drinking. When you are tired or distracted. While texting. If you have been using any  mind-altering substances or drugs. Wear a helmet and other protective equipment during sports activities. If you have firearms in your house, make sure you follow all gun safety procedures. Minimize exposure to UV radiation to reduce your risk of skin cancer. What's next? Visit your health care provider once a year for an annual wellness visit. Ask your health care provider how often you should have your eyes and teeth checked. Stay up to date on all vaccines. This information is not intended to replace advice given to you by your health care provider. Make sure you discuss any questions you have with your health care provider. Document Revised: 04/20/2021 Document Reviewed: 04/20/2021 Elsevier Patient Education  2024 [employer].  Understanding Your Risk for Falls Millions of people have serious injuries from falls each year. It is important to understand your risk of falling. Talk with your health care provider about your risk and what you can do to lower it. If you do have a serious fall, make sure to tell your provider. Falling once raises your risk of falling again. How can falls affect me? Serious injuries from falls are common. These include: Broken bones, such as hip fractures. Head injuries, such as traumatic brain injuries (TBI) or concussions. A fear of falling can cause you to avoid activities and stay at home. This can make  your muscles weaker and raise your risk for a fall. What can increase my risk? There are a number of risk factors that increase your risk for falling. The more risk factors you have, the higher your risk of falling. Serious injuries from a fall happen most often to people who are older than 67 years old. Teenagers and young adults ages 48-29 are also at higher risk. Common risk factors include: Weakness in the lower body. Being generally weak or confused due to long-term (chronic) illness. Dizziness or balance problems. Poor vision. Medicines that cause  dizziness or drowsiness. These may include: Medicines for your blood pressure, heart, anxiety, insomnia, or swelling (edema). Pain medicines. Muscle relaxants. Other risk factors include: Drinking alcohol. Having had a fall in the past. Having foot pain or wearing improper footwear. Working at a dangerous job. Having any of the following in your home: Tripping hazards, such as floor clutter or loose rugs. Poor lighting. Pets. Having dementia or memory loss. What actions can I take to lower my risk of falling?     Physical activity Stay physically fit. Do strength and balance exercises. Consider taking a regular class to build strength and balance. Yoga and tai chi are good options. Vision Have your eyes checked every year and your prescription for glasses or contacts updated as needed. Shoes and walking aids Wear non-skid shoes. Wear shoes that have rubber soles and low heels. Do not wear high heels. Do not walk around the house in socks or slippers. Use a cane or walker as told by your provider. Home safety Attach secure railings on both sides of your stairs. Install grab bars for your bathtub, shower, and toilet. Use a non-skid mat in your bathtub or shower. Attach bath mats securely with double-sided, non-slip rug tape. Use good lighting in all rooms. Keep a flashlight near your bed. Make sure there is a clear path from your bed to the bathroom. Use night-lights. Do not use throw rugs. Make sure all carpeting is taped or tacked down securely. Remove all clutter from walkways and stairways, including extension cords. Repair uneven or broken steps and floors. Avoid walking on icy or slippery surfaces. Walk on the grass instead of on icy or slick sidewalks. Use ice melter to get rid of ice on walkways in the winter. Use a cordless phone. Questions to ask your health care provider Can you help me check my risk for a fall? Do any of my medicines make me more likely to  fall? Should I take a vitamin D supplement? What exercises can I do to improve my strength and balance? Should I make an appointment to have my vision checked? Do I need a bone density test to check for weak bones (osteoporosis)? Would it help to use a cane or a walker? Where to find more information Centers for Disease Control and Prevention, [employer]: TonerPromos.no Community-Based Fall Prevention Programs: TonerPromos.no [employer] on Aging: BaseRingTones.pl Contact a health care provider if: You fall at home. You are afraid of falling at home. You feel weak, drowsy, or dizzy. This information is not intended to replace advice given to you by your health care provider. Make sure you discuss any questions you have with your health care provider. Document Revised: 06/26/2022 Document Reviewed: 06/26/2022 Elsevier Patient Education  2024 [employer].

## 2023-12-07 NOTE — Progress Notes (Signed)
Please attest and cosign this visit due to patients primary care provider not being in the office at the time the visit was completed.  Because this visit was a virtual/telehealth visit,  certain criteria was not obtained, such a blood pressure, CBG if applicable, and timed get up and go. Any medications not marked as "taking" were not mentioned during the medication reconciliation part of the visit. Any vitals not documented were not able to be obtained due to this being a telehealth visit or patient was unable to self-report a recent blood pressure reading due to a lack of equipment at home via telehealth. Vitals that have been documented are verbally provided by the patient.  Interactive audio and video telecommunications were attempted between this provider and patient, however failed, due to patient having technical difficulties OR patient did not have access to video capability.  We continued and completed visit with audio only.  Subjective:   Martin Rodriguez is a 68 y.o. male who presents for Medicare Annual/Subsequent preventive examination.  Visit Complete: Virtual I connected with  Martin Rodriguez on 12/07/23 by a audio enabled telemedicine application and verified that I am speaking with the correct person using two identifiers.  Patient Location: Home  Provider Location: Home Office  I discussed the limitations of evaluation and management by telemedicine. The patient expressed understanding and agreed to proceed.  Vital Signs: Because this visit was a virtual/telehealth visit, some criteria may be missing or patient reported. Any vitals not documented were not able to be obtained and vitals that have been documented are patient reported.  Patient Medicare AWV questionnaire was completed by the patient on 12/03/2023; I have confirmed that all information answered by patient is correct and no changes since this date.  Cardiac Risk Factors include: advanced age (>39men, >64  women);hypertension;male gender;dyslipidemia     Objective:    Today's Vitals   12/07/23 1355  Weight: 168 lb (76.2 kg)  Height: 5\' 7"  (1.702 m)   Body mass index is 26.31 kg/m.     12/07/2023    1:57 PM 12/01/2022    3:00 PM  Advanced Directives  Does Patient Have a Medical Advance Directive? No No  Would patient like information on creating a medical advance directive? No - Patient declined Yes (MAU/Ambulatory/Procedural Areas - Information given)    Current Medications (verified) Outpatient Encounter Medications as of 12/07/2023  Medication Sig   ALPRAZolam (XANAX) 0.5 MG tablet Take 1 tablet (0.5 mg total) by mouth 2 (two) times daily as needed.   aspirin-acetaminophen-caffeine (EXCEDRIN MIGRAINE) 250-250-65 MG tablet Take 2 tablets by mouth every 6 (six) hours as needed for headache.   atorvastatin (LIPITOR) 10 MG tablet TAKE 1 TABLET BY MOUTH EVERY DAY   B Complex Vitamins (B COMPLEX PO) Take by mouth.   Cholecalciferol (VITAMIN D-3) 125 MCG (5000 UT) TABS Take 2 tablets by mouth daily.   citalopram (CELEXA) 20 MG tablet Take 1 tablet (20 mg total) by mouth daily.   lisinopril (ZESTRIL) 20 MG tablet TAKE 1 TABLET BY MOUTH EVERY DAY   loratadine (CLARITIN) 10 MG tablet Take 10 mg by mouth daily as needed.   meloxicam (MOBIC) 15 MG tablet TAKE 1 TABLET BY MOUTH EVERY DAY AS NEEDED FOR PAIN   mometasone-formoterol (DULERA) 100-5 MCG/ACT AERO Inhale 2 puffs into the lungs in the morning and at bedtime.   Multiple Vitamin (MULTIVITAMIN ADULT PO) Take by mouth.   Omeprazole Magnesium (PRILOSEC OTC PO) Take by mouth.   predniSONE (DELTASONE) 10  MG tablet 50 mg daily x 2 days, then 40 mg daily x 2 days, then 30 mg daily x 2 days, then 20 mg daily x 2 days, then 10 mg daily x 2 days.   vitamin k 100 MCG tablet Take 100 mcg by mouth daily.   No facility-administered encounter medications on file as of 12/07/2023.    Allergies (verified) Penicillins and Sulfa antibiotics    History: Past Medical History:  Diagnosis Date   Allergy    seasonal   Anxiety    Asthma    Back pain    COPD (chronic obstructive pulmonary disease) (HCC)    Essential hypertension, benign 03/17/2020   GERD (gastroesophageal reflux disease) 1980's   HLD (hyperlipidemia) 03/17/2020   Prediabetes 03/17/2020   Shoulder pain    Ulcer 1970   Past Surgical History:  Procedure Laterality Date   EYE SURGERY     TONSILECTOMY/ADENOIDECTOMY WITH MYRINGOTOMY     Family History  Problem Relation Age of Onset   Memory loss Mother    COPD Mother    COPD Father    COPD Son    COPD Sister    COPD Brother    COPD Daughter    Social History   Socioeconomic History   Marital status: Divorced    Spouse name: Not on file   Number of children: Not on file   Years of education: Not on file   Highest education level: Not on file  Occupational History   Occupation: retired    Comment: Railroad  Tobacco Use   Smoking status: Never   Smokeless tobacco: Never  Vaping Use   Vaping status: Not on file  Substance and Sexual Activity   Alcohol use: Yes    Alcohol/week: 2.0 standard drinks of alcohol    Types: 1 Glasses of wine, 1 Cans of beer per week    Comment: occasional   Drug use: Never   Sexual activity: Not Currently    Birth control/protection: None  Other Topics Concern   Not on file  Social History Narrative   Single,divorced.Lives with ex-wife.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   Social Drivers of Corporate investment banker Strain: Low Risk  (12/07/2023)   Overall Financial Resource Strain (CARDIA)    Difficulty of Paying Living Expenses: Not hard at all  Food Insecurity: No Food Insecurity (12/07/2023)   Hunger Vital Sign    Worried About Running Out of Food in the Last Year: Never true    Ran Out of Food in the Last Year: Never true  Transportation Needs: No Transportation Needs (12/07/2023)   PRAPARE - Therapist, art (Medical): No    Lack of Transportation (Non-Medical): No  Physical Activity: Sufficiently Active (12/07/2023)   Exercise Vital Sign    Days of Exercise per Week: 7 days    Minutes of Exercise per Session: 30 min  Stress: No Stress Concern Present (12/07/2023)   [employer] of Occupational Health - Occupational Stress Questionnaire    Feeling of Stress : Not at all  Social Connections: Socially Isolated (12/07/2023)   Social Connection and Isolation Panel [NHANES]    Frequency of Communication with Friends and Family: Once a week    Frequency of Social Gatherings with Friends and Family: Once a week    Attends Religious Services: Never    Database administrator or Organizations: No    Attends Banker Meetings: Never  Marital Status: Divorced    Tobacco Counseling Counseling given: Yes   Clinical Intake:  Pre-visit preparation completed: Yes  Pain : No/denies pain     BMI - recorded: 26.31 Nutritional Status: BMI 25 -29 Overweight Nutritional Risks: None Diabetes: No  How often do you need to have someone help you when you read instructions, pamphlets, or other written materials from your doctor or pharmacy?: 1 - Never  Interpreter Needed?: No  Information entered by :: Maryjean Ka CMA   Activities of Daily Living    12/03/2023    9:04 AM  In your present state of health, do you have any difficulty performing the following activities:  Hearing? 0  Vision? 0  Difficulty concentrating or making decisions? 0  Walking or climbing stairs? 0  Dressing or bathing? 0  Doing errands, shopping? 0  Preparing Food and eating ? N  Using the Toilet? N  In the past six months, have you accidently leaked urine? N  Do you have problems with loss of bowel control? N  Managing your Medications? N  Managing your Finances? N  Housekeeping or managing your Housekeeping? N    Patient Care Team: Tommie Sams, DO as PCP - General (Family  Medicine)  Indicate any recent Medical Services you may have received from other than [facility] providers in the past year (date may be approximate).     Assessment:   This is a routine wellness examination for Martin Rodriguez.  Hearing/Vision screen Hearing Screening - Comments:: Patient denies any hearing difficulties.   Vision Screening - Comments:: Wears glasses. Patient states he is utd on yearly exams but he is unable to recall name of provider in [place]   Goals Addressed             This Visit's Progress    Patient Stated       Travel more and explore [place] more since he has only been here for a couple of years.        Depression Screen    12/07/2023    1:58 PM 08/09/2023   11:41 AM 05/29/2023    4:19 PM 12/01/2022    2:29 PM 10/19/2022    3:22 PM 02/21/2022    9:55 AM 02/10/2020    2:45 PM  PHQ 2/9 Scores  PHQ - 2 Score 0 0 0 0 0 0 0  PHQ- 9 Score  1 2  4     Exception Documentation       Medical reason    Fall Risk    12/03/2023    9:04 AM 05/29/2023    4:19 PM 12/01/2022    2:25 PM 11/27/2022   12:02 PM 10/19/2022    3:22 PM  Fall Risk   Falls in the past year? 0 0 0 0 0  Number falls in past yr: 0  0 0 0  Injury with Fall? 0  0 0 0  Risk for fall due to : No Fall Risks    No Fall Risks  Follow up Falls prevention discussed  Falls prevention discussed;Education provided;Falls evaluation completed  Falls evaluation completed    MEDICARE RISK AT HOME: Medicare Risk at Home Any stairs in or around the home?: (Patient-Rptd) No If so, are there any without handrails?: No Home free of loose throw rugs in walkways, pet beds, electrical cords, etc?: (Patient-Rptd) Yes Adequate lighting in your home to reduce risk of falls?: (Patient-Rptd) Yes Life alert?: (Patient-Rptd) No Use of a cane, walker or w/c?: (Patient-Rptd)  No Grab bars in the bathroom?: (Patient-Rptd) No Shower chair or bench in shower?: (Patient-Rptd) No Elevated toilet seat or a handicapped toilet?:  (Patient-Rptd) No  TIMED UP AND GO:  Was the test performed?  No    Cognitive Function:        12/07/2023    1:52 PM 12/01/2022    3:01 PM  6CIT Screen  What Year? 0 points 0 points  What month? 0 points 0 points  What time? 0 points 0 points  Count back from 20 0 points 0 points  Months in reverse 0 points 0 points  Repeat phrase 0 points 0 points  Total Score 0 points 0 points    Immunizations  There is no immunization history on file for this patient.  TDAP Status: Patient declined vaccine Due, Education has been provided regarding the importance of this vaccine. Advised may receive this vaccine at local pharmacy or Health Dept. Aware to provide a copy of the vaccination record if obtained from local pharmacy or Health Dept. Verbalized acceptance and understanding.   Flu Vaccine status: Declined, Education has been provided regarding the importance of this vaccine but patient still declined. Advised may receive this vaccine at local pharmacy or Health Dept. Aware to provide a copy of the vaccination record if obtained from local pharmacy or Health Dept. Verbalized acceptance and understanding.  Pneumococcal vaccine status: Declined,  Education has been provided regarding the importance of this vaccine but patient still declined. Advised may receive this vaccine at local pharmacy or Health Dept. Aware to provide a copy of the vaccination record if obtained from local pharmacy or Health Dept. Verbalized acceptance and understanding.   Covid-19 vaccine status: Declined, Education has been provided regarding the importance of this vaccine but patient still declined. Advised may receive this vaccine at local pharmacy or Health Dept.or vaccine clinic. Aware to provide a copy of the vaccination record if obtained from local pharmacy or Health Dept. Verbalized acceptance and understanding.  Qualifies for Shingles Vaccine? Yes   Patient declines Shingles Vaccine  Screening  Tests Health Maintenance  Topic Date Due   Hepatitis C Screening  Never done   Colonoscopy  Never done   Zoster Vaccines- Shingrix (1 of 2) Never done   Pneumonia Vaccine 6+ Years old (1 of 1 - PCV) Never done   COVID-19 Vaccine (1 - 2024-25 season) Never done   Medicare Annual Wellness (AWV)  12/02/2023   INFLUENZA VACCINE  02/04/2024 (Originally 06/07/2023)   HPV VACCINES  Aged Out   DTaP/Tdap/Td  Discontinued    Health Maintenance  Health Maintenance Due  Topic Date Due   Hepatitis C Screening  Never done   Colonoscopy  Never done   Zoster Vaccines- Shingrix (1 of 2) Never done   Pneumonia Vaccine 76+ Years old (1 of 1 - PCV) Never done   COVID-19 Vaccine (1 - 2024-25 season) Never done   Medicare Annual Wellness (AWV)  12/02/2023    Colorectal Cancer Screening: Patient declined colorectal cancer screening  Lung Cancer Screening: (Low Dose CT Chest recommended if Age 37-80 years, 20 pack-year currently smoking OR have quit w/in 15years.) does not qualify.   Lung Cancer Screening Referral: na  Additional Screening:  Hepatitis C Screening: does qualify; declined  Vision Screening: Recommended annual ophthalmology exams for early detection of glaucoma and other disorders of the eye. Is the patient up to date with their annual eye exam?  Yes  per patient he is up to date on eye  exams Who is the provider or what is the name of the office in which the patient attends annual eye exams? Patient can't recall name of provider but stated it was about 10 months ago when he last had an eye exam.  If pt is not established with a provider, would they like to be referred to a provider to establish care? No .   Dental Screening: Recommended annual dental exams for proper oral hygiene  Diabetic Foot Exam: na  Community Resource Referral / Chronic Care Management: CRR required this visit?  No   CCM required this visit?  No     Plan:     I have personally reviewed and noted the  following in the patient's chart:   Medical and social history Use of alcohol, tobacco or illicit drugs  Current medications and supplements including opioid prescriptions. Patient is not currently taking opioid prescriptions. Functional ability and status Nutritional status Physical activity Advanced directives List of other physicians Hospitalizations, surgeries, and ER visits in previous 12 months Vitals Screenings to include cognitive, depression, and falls Referrals and appointments  In addition, I have reviewed and discussed with patient certain preventive protocols, quality metrics, and best practice recommendations. A written personalized care plan for preventive services as well as general preventive health recommendations were provided to patient.     Martin Rodriguez, CMA   12/07/2023   After Visit Summary: (MyChart) Due to this being a telephonic visit, the after visit summary with patients personalized plan was offered to patient via MyChart   Nurse Notes: see routing comment

## 2023-12-08 ENCOUNTER — Other Ambulatory Visit: Payer: Self-pay | Admitting: Family Medicine

## 2023-12-19 ENCOUNTER — Ambulatory Visit: Payer: MEDICARE | Admitting: Family Medicine

## 2023-12-19 VITALS — BP 124/82 | Temp 97.0°F | Ht 67.0 in | Wt 181.0 lb

## 2023-12-19 DIAGNOSIS — R002 Palpitations: Secondary | ICD-10-CM

## 2023-12-19 MED ORDER — ALPRAZOLAM 0.5 MG PO TABS: 0.5000 mg | ORAL_TABLET | Freq: Two times a day (BID) | ORAL | 3 refills | Status: DC | PRN

## 2023-12-19 MED ORDER — MELOXICAM 15 MG PO TABS: ORAL_TABLET | ORAL | 1 refills | Status: DC

## 2023-12-19 MED ORDER — METOPROLOL SUCCINATE ER 25 MG PO TB24: 12.5000 mg | ORAL_TABLET | Freq: Every day | ORAL | 1 refills | Status: AC

## 2023-12-19 NOTE — Patient Instructions (Signed)
Medication as prescribed.  Referral placed.

## 2023-12-19 NOTE — Progress Notes (Signed)
Subjective:  Patient ID: Martin Rodriguez, male    DOB: 1956/08/20  Age: 68 y.o. MRN: [id number]  CC:   Chief Complaint  Patient presents with   concerned with heart issues    Has fam hx of heart problems - has been exp worsened palpitations, flutters - check for heart afib    HPI:  68 year old male presents for evaluation of the above.  Patient reports that over the past month he has had intermittent palpitations.  He states that he feels like his heart beats strongly and then pauses for second and then beats normally.  He states that it often makes him feel like he is short of breath at rest if he needs to take a deep breath.  Denies any tachycardia.  No other associated symptoms such as chest pain or syncope.  He reports a family history of A-fib.  He is concerned about this.  Patient Active Problem List   Diagnosis Date Noted   Palpitations 12/19/2023   Chronic cough 08/09/2023   Kidney stone 05/29/2023   OSA (obstructive sleep apnea) 05/05/2022   Chronic left shoulder pain 02/21/2022   Essential hypertension, benign 03/17/2020   Anxiety 03/17/2020   HLD (hyperlipidemia) 03/17/2020    Social Hx   Social History   Socioeconomic History   Marital status: Divorced    Spouse name: Not on file   Number of children: Not on file   Years of education: Not on file   Highest education level: Not on file  Occupational History   Occupation: retired    Comment: Railroad  Tobacco Use   Smoking status: Never   Smokeless tobacco: Never  Vaping Use   Vaping status: Not on file  Substance and Sexual Activity   Alcohol use: Yes    Alcohol/week: 2.0 standard drinks of alcohol    Types: 1 Glasses of wine, 1 Cans of beer per week    Comment: occasional   Drug use: Never   Sexual activity: Not Currently    Birth control/protection: None  Other Topics Concern   Not on file  Social History Narrative   Single,divorced.Lives with ex-wife.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   Social Drivers of Corporate investment banker Strain: Low Risk  (12/07/2023)   Overall Financial Resource Strain (CARDIA)    Difficulty of Paying Living Expenses: Not hard at all  Food Insecurity: No Food Insecurity (12/07/2023)   Hunger Vital Sign    Worried About Running Out of Food in the Last Year: Never true    Ran Out of Food in the Last Year: Never true  Transportation Needs: No Transportation Needs (12/07/2023)   PRAPARE - Administrator, Civil Service (Medical): No    Lack of Transportation (Non-Medical): No  Physical Activity: Sufficiently Active (12/07/2023)   Exercise Vital Sign    Days of Exercise per Week: 7 days    Minutes of Exercise per Session: 30 min  Stress: No Stress Concern Present (12/07/2023)   [employer] of Occupational Health - Occupational Stress Questionnaire    Feeling of Stress : Not at all  Social Connections: Socially Isolated (12/07/2023)   Social Connection and Isolation Panel [NHANES]    Frequency of Communication with Friends and Family: Once a week    Frequency of Social Gatherings with Friends and Family: Once a week    Attends Religious Services: Never    Database administrator or Organizations: No    Attends  Banker Meetings: Never    Marital Status: Divorced    Review of Systems Per HPI  Objective:  BP 124/82   Temp (!) 97 F (36.1 C)   Ht 5\' 7"  (1.702 m)   Wt 181 lb (82.1 kg)   SpO2 98%   BMI 28.35 kg/m      12/19/2023    3:52 PM 12/19/2023    3:25 PM 12/07/2023    1:55 PM  BP/Weight  Systolic BP 124 145 --  Diastolic BP 82 96 --  Wt. (Lbs)  181 168  BMI  28.35 kg/m2 26.31 kg/m2    Physical Exam Constitutional:      General: He is not in acute distress.    Appearance: Normal appearance.  HENT:     Head: Normocephalic and atraumatic.  Cardiovascular:     Rate and Rhythm: Normal rate and regular rhythm.  Pulmonary:     Effort: Pulmonary effort is  normal.     Breath sounds: Normal breath sounds. No wheezing or rales.  Neurological:     Mental Status: He is alert.  Psychiatric:        Mood and Affect: Mood normal.        Behavior: Behavior normal.     Lab Results  Component Value Date   WBC 7.4 02/21/2022   HGB 14.7 02/21/2022   HCT 42.8 02/21/2022   PLT 239 02/21/2022   GLUCOSE 107 (H) 02/21/2022   CHOL 147 02/21/2022   TRIG 166 (H) 02/21/2022   HDL 46 02/21/2022   LDLCALC 73 02/21/2022   ALT 25 02/21/2022   AST 23 02/21/2022   NA 141 02/21/2022   K 4.1 02/21/2022   CL 102 02/21/2022   CREATININE 1.20 02/21/2022   BUN 21 02/21/2022   CO2 24 02/21/2022   TSH 3.57 02/10/2020   PSA 0.8 02/10/2020   HGBA1C 5.8 (H) 02/21/2022   EKG: Independent interpretation-normal sinus rhythm at the rate of 75.  Normal axis.  Normal intervals.  No ST or T wave changes.  Normal EKG  Assessment & Plan:   Problem List Items Addressed This Visit       Other   Palpitations - Primary   New problem.  Etiology and prognosis uncertain without further evaluation with Zio patch.  History suggestive of PVC.  EKG was obtained today and did not reveal any PVCs.  EKG was independently interpreted by me.  Normal EKG. Referring to cardiology for Zio patch. Placing on low dose metoprolol.       Relevant Orders   EKG 12-Lead (Completed)   Ambulatory referral to Cardiology    Meds ordered this encounter  Medications   meloxicam (MOBIC) 15 MG tablet    Sig: TAKE 1 TABLET BY MOUTH EVERY DAY AS NEEDED FOR PAIN    Dispense:  30 tablet    Refill:  1   ALPRAZolam (XANAX) 0.5 MG tablet    Sig: Take 1 tablet (0.5 mg total) by mouth 2 (two) times daily as needed.    Dispense:  60 tablet    Refill:  3   metoprolol succinate (TOPROL-XL) 25 MG 24 hr tablet    Sig: Take 0.5 tablets (12.5 mg total) by mouth daily.    Dispense:  30 tablet    Refill:  1    Annett Boxwell DO [facility]

## 2023-12-19 NOTE — Assessment & Plan Note (Addendum)
New problem.  Etiology and prognosis uncertain without further evaluation with Zio patch.  History suggestive of PVC.  EKG was obtained today and did not reveal any PVCs.  EKG was independently interpreted by me.  Normal EKG. Referring to cardiology for Zio patch. Placing on low dose metoprolol.

## 2023-12-20 ENCOUNTER — Other Ambulatory Visit: Payer: Self-pay | Admitting: Family Medicine

## 2024-01-10 ENCOUNTER — Encounter: Payer: Self-pay | Admitting: Adult Health

## 2024-01-26 ENCOUNTER — Other Ambulatory Visit: Payer: Self-pay | Admitting: Family Medicine

## 2024-02-08 ENCOUNTER — Other Ambulatory Visit: Payer: Self-pay | Admitting: Family Medicine

## 2024-03-19 ENCOUNTER — Ambulatory Visit: Payer: MEDICARE | Admitting: Internal Medicine

## 2024-03-19 ENCOUNTER — Other Ambulatory Visit: Payer: Self-pay | Admitting: Family Medicine

## 2024-03-26 ENCOUNTER — Ambulatory Visit: Payer: Self-pay

## 2024-03-26 NOTE — Telephone Encounter (Signed)
  Chief Complaint: sinus pain Symptoms: sinus pain and pressure, nasal congestion, sore throat, cough, fever Frequency: 3-4 days  Pertinent Negatives: NA Disposition: [] ED /[] Urgent Care (no appt availability in office) / [x] Appointment(In office/virtual)/ []  [facility]/ [] Home Care/ [] Refused Recommended Disposition /[] [place] [facility]/ []  Follow-up with PCP Additional Notes: pt states he has been taking OTC sinus meds and not helping with sx. Has been having R ear pain as well. Wanting to be seen to get abx. Appt scheduled for 03/28/24 at 1120 with PCP d/t pt request. Added pt to waitlist. 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   Copied from CRM [id number]. Topic: Clinical - Red Word Triage >> Mar 26, 2024  4:34 PM Rachelle R wrote: Red Word that prompted transfer to Nurse Triage: Patient states he is having some sinus issues. Has a headache, low grade fever, pain around his sinus areas and ears. Reason for Disposition  Earache  Answer Assessment - Initial Assessment Questions 1. LOCATION: "Where does it hurt?"      Head and nasal area 2. ONSET: "When did the sinus pain start?"  (e.g., hours, days)      3-4 days ago  3. SEVERITY: "How bad is the pain?"   (Scale 1-10; mild, moderate or severe)   - MILD (1-3): doesn't interfere with normal activities    - MODERATE (4-7): interferes with normal activities (e.g., work or school) or awakens from sleep   - SEVERE (8-10): excruciating pain and patient unable to do any normal activities        Mild to moderate  5. NASAL CONGESTION: "Is the nose blocked?" If Yes, ask: "Can you open it or must you breathe through your mouth?"     yes 6. NASAL DISCHARGE: "Do you have discharge from your nose?" If so ask, "What color?"     no 7. FEVER: "Do you have a fever?" If Yes, ask: "What is it, how was it measured, and when did it start?"      Maybe low grade  8. OTHER SYMPTOMS: "Do you have any other symptoms?" (e.g.,  sore throat, cough, earache, difficulty breathing)     Cough and sore throat  Protocols used: Sinus Pain or Congestion-A-AH

## 2024-03-28 ENCOUNTER — Ambulatory Visit (INDEPENDENT_AMBULATORY_CARE_PROVIDER_SITE_OTHER): Payer: MEDICARE | Admitting: Family Medicine

## 2024-03-28 VITALS — BP 133/76 | HR 94 | Temp 97.3°F | Ht 67.0 in | Wt 182.0 lb

## 2024-03-28 DIAGNOSIS — J0110 Acute frontal sinusitis, unspecified: Secondary | ICD-10-CM

## 2024-03-28 MED ORDER — DOXYCYCLINE HYCLATE 100 MG PO TABS: 100.0000 mg | ORAL_TABLET | Freq: Two times a day (BID) | ORAL | 0 refills | Status: DC

## 2024-03-31 DIAGNOSIS — J329 Chronic sinusitis, unspecified: Secondary | ICD-10-CM | POA: Insufficient documentation

## 2024-03-31 NOTE — Progress Notes (Signed)
 Subjective:  Patient ID: Martin Rodriguez, male    DOB: 1956-10-12  Age: 68 y.o. MRN: [id number]  CC:   Chief Complaint  Patient presents with   Sinusitis    Sinus congestion and ear pain x's 3 days . Using otc afrin - to little relief     HPI:  68 year old male presents for evaluation of the above.  Patient reports ongoing symptoms over the past 5 days.  Reports sinus pressure and congestion.  Also reports right ear pain.  No documented fever.  No relieving factors.  No other associated symptoms.  No other complaints.  Patient Active Problem List   Diagnosis Date Noted   Sinusitis 03/31/2024   Palpitations 12/19/2023   Chronic cough 08/09/2023   Kidney stone 05/29/2023   OSA (obstructive sleep apnea) 05/05/2022   Chronic left shoulder pain 02/21/2022   Essential hypertension, benign 03/17/2020   Anxiety 03/17/2020   HLD (hyperlipidemia) 03/17/2020    Social Hx   Social History   Socioeconomic History   Marital status: Divorced    Spouse name: Not on file   Number of children: Not on file   Years of education: Not on file   Highest education level: Some college, no degree  Occupational History   Occupation: retired    Comment: [employer]  Tobacco Use   Smoking status: Never   Smokeless tobacco: Never  Vaping Use   Vaping status: Not on file  Substance and Sexual Activity   Alcohol use: Yes    Alcohol/week: 2.0 standard drinks of alcohol    Types: 1 Glasses of wine, 1 Cans of beer per week    Comment: occasional   Drug use: Never   Sexual activity: Not Currently    Birth control/protection: None  Other Topics Concern   Not on file  Social History Narrative   Single,divorced.Lives with ex-wife.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   Social Drivers of Corporate investment banker Strain: Low Risk  (03/28/2024)   Overall Financial Resource Strain (CARDIA)    Difficulty of Paying Living Expenses: Not very hard  Food  Insecurity: No Food Insecurity (03/28/2024)   Hunger Vital Sign    Worried About Running Out of Food in the Last Year: Never true    Ran Out of Food in the Last Year: Never true  Transportation Needs: No Transportation Needs (03/28/2024)   PRAPARE - Administrator, Civil Service (Medical): No    Lack of Transportation (Non-Medical): No  Physical Activity: Sufficiently Active (12/07/2023)   Exercise Vital Sign    Days of Exercise per Week: 7 days    Minutes of Exercise per Session: 30 min  Stress: No Stress Concern Present (03/28/2024)   [employer] of Occupational Health - Occupational Stress Questionnaire    Feeling of Stress : Only a little  Social Connections: Socially Isolated (03/28/2024)   Social Connection and Isolation Panel [NHANES]    Frequency of Communication with Friends and Family: Three times a week    Frequency of Social Gatherings with Friends and Family: Twice a week    Attends Religious Services: Never    Database administrator or Organizations: No    Attends Engineer, structural: Never    Marital Status: Divorced    Review of Systems Per HPI  Objective:  BP 133/76   Pulse 94   Temp (!) 97.3 F (36.3 C)   Ht 5\' 7"  (1.702 m)   Wt 182  lb (82.6 kg)   SpO2 96%   BMI 28.51 kg/m      03/28/2024   11:06 AM 12/19/2023    3:52 PM 12/19/2023    3:25 PM  BP/Weight  Systolic BP 133 124 145  Diastolic BP 76 82 96  Wt. (Lbs) 182  181  BMI 28.51 kg/m2  28.35 kg/m2    Physical Exam Vitals and nursing note reviewed.  Constitutional:      General: He is not in acute distress. HENT:     Head: Normocephalic and atraumatic.     Nose:     Comments: Frontal sinus tenderness. Cardiovascular:     Rate and Rhythm: Normal rate and regular rhythm.  Pulmonary:     Effort: Pulmonary effort is normal.     Breath sounds: Normal breath sounds. No wheezing or rales.  Neurological:     Mental Status: He is alert.  Psychiatric:        Mood and  Affect: Mood normal.        Behavior: Behavior normal.     Lab Results  Component Value Date   WBC 7.4 02/21/2022   HGB 14.7 02/21/2022   HCT 42.8 02/21/2022   PLT 239 02/21/2022   GLUCOSE 107 (H) 02/21/2022   CHOL 147 02/21/2022   TRIG 166 (H) 02/21/2022   HDL 46 02/21/2022   LDLCALC 73 02/21/2022   ALT 25 02/21/2022   AST 23 02/21/2022   NA 141 02/21/2022   K 4.1 02/21/2022   CL 102 02/21/2022   CREATININE 1.20 02/21/2022   BUN 21 02/21/2022   CO2 24 02/21/2022   TSH 3.57 02/10/2020   PSA 0.8 02/10/2020   HGBA1C 5.8 (H) 02/21/2022     Assessment & Plan:  Acute frontal sinusitis, recurrence not specified Assessment & Plan: Treating with Doxy.   Other orders -     Doxycycline Hyclate; Take 1 tablet (100 mg total) by mouth 2 (two) times daily.  Dispense: 14 tablet; Refill: 0    Follow-up:  Return if symptoms worsen or fail to improve.  Kathleen Papa DO [facility]

## 2024-03-31 NOTE — Assessment & Plan Note (Signed)
Treating with Doxy. 

## 2024-04-19 ENCOUNTER — Other Ambulatory Visit: Payer: Self-pay | Admitting: Family Medicine

## 2024-04-21 ENCOUNTER — Other Ambulatory Visit: Payer: Self-pay

## 2024-04-21 MED ORDER — MELOXICAM 15 MG PO TABS: ORAL_TABLET | ORAL | 0 refills | Status: DC

## 2024-04-23 ENCOUNTER — Other Ambulatory Visit: Payer: Self-pay | Admitting: Family Medicine

## 2024-04-24 ENCOUNTER — Other Ambulatory Visit: Payer: Self-pay

## 2024-04-24 MED ORDER — ATORVASTATIN CALCIUM 10 MG PO TABS: 10.0000 mg | ORAL_TABLET | Freq: Every day | ORAL | 0 refills | Status: DC

## 2024-05-21 ENCOUNTER — Other Ambulatory Visit: Payer: Self-pay | Admitting: Family Medicine

## 2024-06-10 ENCOUNTER — Other Ambulatory Visit: Payer: Self-pay | Admitting: Family Medicine

## 2024-06-16 ENCOUNTER — Ambulatory Visit: Payer: Self-pay

## 2024-06-16 NOTE — Telephone Encounter (Signed)
 FYI Only or Action Required?: FYI only for provider.  Patient was last seen in primary care on 03/28/2024 by Cook, Jayce G, DO.  Called Nurse Triage reporting Palpitations.  Symptoms began 1-2 days.  Interventions attempted: Nothing.  Symptoms are: stable.  Triage Disposition: See Within 3 Days in Office (overriding See HCP Within 4 Hours (Or PCP Triage))  Patient/caregiver understands and will follow disposition?: Yes                             Copied from CRM [id number]. Topic: Clinical - Red Word Triage >> Jun 16, 2024  1:17 PM Delon DASEN wrote: Red Word that prompted transfer to Nurse Triage: feeling a fluttering in left side of chest about every 5-10 minutes with no pain and tingling in fingers Reason for Disposition  Age > 60 years  (Exception: Brief heartbeat symptoms that went away and now feels well.)  Answer Assessment - Initial Assessment Questions 1. DESCRIPTION: Please describe your heart rate or heartbeat that you are having (e.g., fast/slow, regular/irregular, skipped or extra beats, palpitations)     Electrical current feeling in left side of chest 2. ONSET: When did it start? (e.g., minutes, hours, days)      1-2 days ago  3. DURATION: How long does it last (e.g., seconds, minutes, hours)     5-10 seconds  4. PATTERN Does it come and go, or has it been constant since it started?  Does it get worse with exertion?   Are you feeling it now?     Comes and goes 6. HEART RATE: Can you tell me your heart rate? How many beats in 15 seconds?  Note: Not all patients can do this.       States he does not notice a change in HR when sensation happens, states he has palpitations on and off over the course of a year  7. RECURRENT SYMPTOM: Have you ever had this before? If Yes, ask: When was the last time? and What happened that time?      States it happened last month, but went away after 1-2 days  8. CAUSE: What do you think  is causing the palpitations?     Unsure  9. CARDIAC HISTORY: Do you have any history of heart disease? (e.g., heart attack, angina, bypass surgery, angioplasty, arrhythmia)      Denies 10. OTHER SYMPTOMS: Do you have any other symptoms? (e.g., dizziness, chest pain, sweating, difficulty breathing)     Denies weakness, denies chest pain/pressure, denies difficulty breathing worse than baseline, denies coughing, denies wheezing, dizziness related to ear problems at baseline    Scheduled first available appointment with PCP.  Protocols used: Heart Rate and Heartbeat Questions-A-AH

## 2024-06-16 NOTE — Telephone Encounter (Signed)
Appt scheduled 8/12.

## 2024-06-17 ENCOUNTER — Ambulatory Visit (INDEPENDENT_AMBULATORY_CARE_PROVIDER_SITE_OTHER): Payer: MEDICARE | Admitting: Family Medicine

## 2024-06-17 VITALS — BP 114/70 | HR 91 | Temp 98.0°F | Ht 67.0 in | Wt 181.0 lb

## 2024-06-17 DIAGNOSIS — N200 Calculus of kidney: Secondary | ICD-10-CM

## 2024-06-17 DIAGNOSIS — R0789 Other chest pain: Secondary | ICD-10-CM | POA: Insufficient documentation

## 2024-06-17 NOTE — Progress Notes (Signed)
 Subjective:  Patient ID: Martin Rodriguez, male    DOB: October 23, 1956  Age: 68 y.o. MRN: [id number]  CC:  Odd sensation of the chest; kidney stones   HPI:  68 year old male presents for evaluation of the above.  Close over the past 4 to 5 days he has had a electrical buzzing sensation to the left side of his chest.  Intermittent.  Denies any true chest pain.  Denies shortness of breath.  Patient concerned that this may be cardiac in origin.  Patient also reports ongoing issues with kidney stones.  He states that he passes a stone approximately every month.  He has a medicine bottle with the stones.  Will discuss urological evaluation today.  Patient Active Problem List   Diagnosis Date Noted   Chest discomfort 06/17/2024   Palpitations 12/19/2023   Chronic cough 08/09/2023   Kidney stone 05/29/2023   OSA (obstructive sleep apnea) 05/05/2022   Chronic left shoulder pain 02/21/2022   Essential hypertension, benign 03/17/2020   Anxiety 03/17/2020   HLD (hyperlipidemia) 03/17/2020    Social Hx   Social History   Socioeconomic History   Marital status: Divorced    Spouse name: Not on file   Number of children: Not on file   Years of education: Not on file   Highest education level: Associate degree: occupational, Scientist, product/process development, or vocational program  Occupational History   Occupation: retired    Comment: [employer]  Tobacco Use   Smoking status: Never   Smokeless tobacco: Never  Vaping Use   Vaping status: Not on file  Substance and Sexual Activity   Alcohol use: Yes    Alcohol/week: 2.0 standard drinks of alcohol    Types: 1 Glasses of wine, 1 Cans of beer per week    Comment: occasional   Drug use: Never   Sexual activity: Not Currently    Birth control/protection: None  Other Topics Concern   Not on file  Social History Narrative   Single,divorced.Lives with ex-wife.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   Social Drivers of Manufacturing engineer Strain: Low Risk  (06/17/2024)   Overall Financial Resource Strain (CARDIA)    Difficulty of Paying Living Expenses: Not hard at all  Food Insecurity: No Food Insecurity (06/17/2024)   Hunger Vital Sign    Worried About Running Out of Food in the Last Year: Never true    Ran Out of Food in the Last Year: Never true  Transportation Needs: No Transportation Needs (06/17/2024)   PRAPARE - Administrator, Civil Service (Medical): No    Lack of Transportation (Non-Medical): No  Physical Activity: Insufficiently Active (06/17/2024)   Exercise Vital Sign    Days of Exercise per Week: 2 days    Minutes of Exercise per Session: 60 min  Stress: No Stress Concern Present (06/17/2024)   [employer] of Occupational Health - Occupational Stress Questionnaire    Feeling of Stress: Only a little  Social Connections: Socially Isolated (06/17/2024)   Social Connection and Isolation Panel    Frequency of Communication with Friends and Family: Once a week    Frequency of Social Gatherings with Friends and Family: Twice a week    Attends Religious Services: Never    Database administrator or Organizations: No    Attends Engineer, structural: Not on file    Marital Status: Divorced    Review of Systems Per HPI  Objective:  BP 114/70  Pulse 91   Temp 98 F (36.7 C)   Ht 5' 7 (1.702 m)   Wt 181 lb (82.1 kg)   SpO2 97%   BMI 28.35 kg/m      06/17/2024    3:42 PM 03/28/2024   11:06 AM 12/19/2023    3:52 PM  BP/Weight  Systolic BP 114 133 124  Diastolic BP 70 76 82  Wt. (Lbs) 181 182   BMI 28.35 kg/m2 28.51 kg/m2     Physical Exam Vitals and nursing note reviewed.  Constitutional:      General: He is not in acute distress.    Appearance: Normal appearance.  HENT:     Head: Normocephalic and atraumatic.  Cardiovascular:     Rate and Rhythm: Normal rate and regular rhythm.  Pulmonary:     Effort: Pulmonary effort is normal.     Breath  sounds: Normal breath sounds. No wheezing or rales.  Neurological:     Mental Status: He is alert.     Lab Results  Component Value Date   WBC 7.4 02/21/2022   HGB 14.7 02/21/2022   HCT 42.8 02/21/2022   PLT 239 02/21/2022   GLUCOSE 107 (H) 02/21/2022   CHOL 147 02/21/2022   TRIG 166 (H) 02/21/2022   HDL 46 02/21/2022   LDLCALC 73 02/21/2022   ALT 25 02/21/2022   AST 23 02/21/2022   NA 141 02/21/2022   K 4.1 02/21/2022   CL 102 02/21/2022   CREATININE 1.20 02/21/2022   BUN 21 02/21/2022   CO2 24 02/21/2022   TSH 3.57 02/10/2020   PSA 0.8 02/10/2020   HGBA1C 5.8 (H) 02/21/2022     Assessment & Plan:  Chest discomfort Assessment & Plan: History not consistent with cardiac issue.  EKG obtained today.  Independent interpretation: Normal sinus rhythm with a rate of 88.  Normal axis.  Normal intervals.  No ST or T wave changes.  Normal EKG.  Advised to let me know if this continues to persist.  I suspect this is benign and self-limiting.  Orders: -     EKG 12-Lead  Kidney stone Assessment & Plan: We discussed ways to prevent kidney stones.  He needs a stone analysis and 24-hour urine collection for further evaluation.  Referring to urology.  Orders: -     Ambulatory referral to Urology    Jacqulyn Ahle DO [facility]

## 2024-06-17 NOTE — Assessment & Plan Note (Signed)
 We discussed ways to prevent kidney stones.  He needs a stone analysis and 24-hour urine collection for further evaluation.  Referring to urology.

## 2024-06-17 NOTE — Patient Instructions (Signed)
 It this continues, please let me know.  Referring to urology.

## 2024-06-17 NOTE — Assessment & Plan Note (Addendum)
 History not consistent with cardiac issue.  EKG obtained today.  Independent interpretation: Normal sinus rhythm with a rate of 88.  Normal axis.  Normal intervals.  No ST or T wave changes.  Normal EKG.  Advised to let me know if this continues to persist.  I suspect this is benign and self-limiting.

## 2024-06-22 ENCOUNTER — Other Ambulatory Visit: Payer: Self-pay | Admitting: Family Medicine

## 2024-06-23 ENCOUNTER — Other Ambulatory Visit: Payer: Self-pay

## 2024-06-23 MED ORDER — MELOXICAM 15 MG PO TABS: ORAL_TABLET | ORAL | 0 refills | Status: DC

## 2024-07-08 ENCOUNTER — Telehealth: Payer: MEDICARE | Admitting: Physician Assistant

## 2024-07-08 DIAGNOSIS — R059 Cough, unspecified: Secondary | ICD-10-CM

## 2024-07-08 NOTE — Progress Notes (Signed)
 Erroneous EV created by patient. Pt reported Appointment with PCP scheduled for 9/3 in [place].

## 2024-07-09 ENCOUNTER — Encounter: Payer: Self-pay | Admitting: Family Medicine

## 2024-07-09 ENCOUNTER — Ambulatory Visit: Payer: MEDICARE | Admitting: Family Medicine

## 2024-07-09 VITALS — BP 97/65 | HR 60 | Ht 67.0 in | Wt 182.0 lb

## 2024-07-09 DIAGNOSIS — J40 Bronchitis, not specified as acute or chronic: Secondary | ICD-10-CM | POA: Diagnosis not present

## 2024-07-09 MED ORDER — PREDNISONE 50 MG PO TABS
50.0000 mg | ORAL_TABLET | Freq: Every day | ORAL | 0 refills | Status: AC
Start: 2024-07-09 — End: 2024-07-14

## 2024-07-09 NOTE — Assessment & Plan Note (Signed)
Treating with Prednisone.

## 2024-07-09 NOTE — Patient Instructions (Signed)
 Medication as prescribed.  Take care  Dr. Adriana Simas

## 2024-07-09 NOTE — Progress Notes (Signed)
 Subjective:  Patient ID: Martin Rodriguez, male    DOB: Jul 09, 1956  Age: 68 y.o. MRN: [id number]  CC:   Chief Complaint  Patient presents with   URI    Present 3-4 days, exposed to dust and mold in a shed    HPI:  68 year old male presents for evaluation of cough.  Patient reports that he has had a dry cough for the past 4 days.  No fever.  Recent exposure to dust and mold.  No relieving factors.  No other complaints.  Patient Active Problem List   Diagnosis Date Noted   Bronchitis 07/09/2024   Palpitations 12/19/2023   Chronic cough 08/09/2023   Kidney stone 05/29/2023   OSA (obstructive sleep apnea) 05/05/2022   Chronic left shoulder pain 02/21/2022   Essential hypertension, benign 03/17/2020   Anxiety 03/17/2020   HLD (hyperlipidemia) 03/17/2020    Social Hx   Social History   Socioeconomic History   Marital status: Divorced    Spouse name: Not on file   Number of children: Not on file   Years of education: Not on file   Highest education level: Associate degree: occupational, Scientist, product/process development, or vocational program  Occupational History   Occupation: retired    Comment: [employer]  Tobacco Use   Smoking status: Never   Smokeless tobacco: Never  Vaping Use   Vaping status: Not on file  Substance and Sexual Activity   Alcohol use: Yes    Alcohol/week: 2.0 standard drinks of alcohol    Types: 1 Glasses of wine, 1 Cans of beer per week    Comment: occasional   Drug use: Never   Sexual activity: Not Currently    Birth control/protection: None  Other Topics Concern   Not on file  Social History Narrative   Single,divorced.Lives with ex-wife.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   Social Drivers of Corporate investment banker Strain: Low Risk  (06/17/2024)   Overall Financial Resource Strain (CARDIA)    Difficulty of Paying Living Expenses: Not hard at all  Food Insecurity: No Food Insecurity (06/17/2024)   Hunger Vital Sign     Worried About Running Out of Food in the Last Year: Never true    Ran Out of Food in the Last Year: Never true  Transportation Needs: No Transportation Needs (06/17/2024)   PRAPARE - Administrator, Civil Service (Medical): No    Lack of Transportation (Non-Medical): No  Physical Activity: Insufficiently Active (06/17/2024)   Exercise Vital Sign    Days of Exercise per Week: 2 days    Minutes of Exercise per Session: 60 min  Stress: No Stress Concern Present (06/17/2024)   [employer] of Occupational Health - Occupational Stress Questionnaire    Feeling of Stress: Only a little  Social Connections: Socially Isolated (06/17/2024)   Social Connection and Isolation Panel    Frequency of Communication with Friends and Family: Once a week    Frequency of Social Gatherings with Friends and Family: Twice a week    Attends Religious Services: Never    Database administrator or Organizations: No    Attends Engineer, structural: Not on file    Marital Status: Divorced    Review of Systems Per HPI  Objective:  BP 97/65   Pulse 60   Ht 5' 7 (1.702 m)   Wt 182 lb (82.6 kg)   SpO2 97%   BMI 28.51 kg/m      07/09/2024  11:14 AM 06/17/2024    3:42 PM 03/28/2024   11:06 AM  BP/Weight  Systolic BP 97 114 133  Diastolic BP 65 70 76  Wt. (Lbs) 182 181 182  BMI 28.51 kg/m2 28.35 kg/m2 28.51 kg/m2    Physical Exam Vitals and nursing note reviewed.  Constitutional:      General: He is not in acute distress.    Appearance: Normal appearance.  HENT:     Head: Normocephalic and atraumatic.  Cardiovascular:     Rate and Rhythm: Normal rate and regular rhythm.  Pulmonary:     Effort: Pulmonary effort is normal.     Breath sounds: Normal breath sounds. No wheezing or rales.  Neurological:     Mental Status: He is alert.  Psychiatric:        Mood and Affect: Mood normal.        Behavior: Behavior normal.     Lab Results  Component Value Date   WBC 7.4  02/21/2022   HGB 14.7 02/21/2022   HCT 42.8 02/21/2022   PLT 239 02/21/2022   GLUCOSE 107 (H) 02/21/2022   CHOL 147 02/21/2022   TRIG 166 (H) 02/21/2022   HDL 46 02/21/2022   LDLCALC 73 02/21/2022   ALT 25 02/21/2022   AST 23 02/21/2022   NA 141 02/21/2022   K 4.1 02/21/2022   CL 102 02/21/2022   CREATININE 1.20 02/21/2022   BUN 21 02/21/2022   CO2 24 02/21/2022   TSH 3.57 02/10/2020   PSA 0.8 02/10/2020   HGBA1C 5.8 (H) 02/21/2022     Assessment & Plan:  Bronchitis Assessment & Plan: Treating with Prednisone .   Other orders -     predniSONE ; Take 1 tablet (50 mg total) by mouth daily for 5 days.  Dispense: 5 tablet; Refill: 0    Follow-up:  Return if symptoms worsen or fail to improve.  Jacqulyn Ahle DO [facility]

## 2024-07-14 ENCOUNTER — Encounter: Payer: Self-pay | Admitting: Family Medicine

## 2024-07-14 NOTE — Progress Notes (Signed)
 07/15/2024 7:02 AM   Martin Rodriguez Nov 12, 1955 [id number]  Referring provider: Cook, Jayce G, DO [address] [place],  [place] [address]  No chief complaint on file.   HPI:    PMH: Past Medical History:  Diagnosis Date   Allergy    seasonal   Anxiety    Asthma    Back pain    COPD (chronic obstructive pulmonary disease) (HCC)    Essential hypertension, benign 03/17/2020   GERD (gastroesophageal reflux disease) 1980's   HLD (hyperlipidemia) 03/17/2020   Prediabetes 03/17/2020   Shoulder pain    Ulcer 1970    Surgical History: Past Surgical History:  Procedure Laterality Date   EYE SURGERY     TONSILECTOMY/ADENOIDECTOMY WITH MYRINGOTOMY      Home Medications:  Allergies as of 07/15/2024       Reactions   Penicillins Shortness Of Breath, Swelling   Sulfa Antibiotics Hives, Itching        Medication List        Accurate as of July 14, 2024  7:02 AM. If you have any questions, ask your nurse or doctor.          ALPRAZolam  0.5 MG tablet Commonly known as: XANAX  Take 1 tablet (0.5 mg total) by mouth 2 (two) times daily as needed for anxiety.   atorvastatin  10 MG tablet Commonly known as: LIPITOR Take 1 tablet (10 mg total) by mouth daily.   B COMPLEX PO Take by mouth.   citalopram  20 MG tablet Commonly known as: CELEXA  TAKE 1 TABLET BY MOUTH EVERY DAY   Dulera  100-5 MCG/ACT Aero Generic drug: mometasone-formoterol  Inhale 2 puffs into the lungs in the morning and at bedtime.   Excedrin Migraine 250-250-65 MG tablet Generic drug: aspirin-acetaminophen-caffeine Take 2 tablets by mouth every 6 (six) hours as needed for headache.   lisinopril  20 MG tablet Commonly known as: ZESTRIL  TAKE 1 TABLET BY MOUTH EVERY DAY   loratadine 10 MG tablet Commonly known as: CLARITIN Take 10 mg by mouth daily as needed.   meloxicam  15 MG tablet Commonly known as: MOBIC  TAKE 1 TABLET BY MOUTH EVERY DAY AS NEEDED FOR PAIN   metoprolol  succinate  25 MG 24 hr tablet Commonly known as: TOPROL -XL Take 0.5 tablets (12.5 mg total) by mouth daily.   MULTIVITAMIN ADULT PO Take by mouth.   PRILOSEC OTC PO Take by mouth.   Vitamin D-3 125 MCG (5000 UT) Tabs Take 2 tablets by mouth daily.   vitamin k 100 MCG tablet Take 100 mcg by mouth daily.        Allergies:  Allergies  Allergen Reactions   Penicillins Shortness Of Breath and Swelling   Sulfa Antibiotics Hives and Itching    Family History: Family History  Problem Relation Age of Onset   Memory loss Mother    COPD Mother    COPD Father    COPD Son    COPD Sister    COPD Brother    COPD Daughter     Social History:  reports that he has never smoked. He has never used smokeless tobacco. He reports current alcohol use of about 2.0 standard drinks of alcohol per week. He reports that he does not use drugs.  ROS: All other review of systems were reviewed and are negative except what is noted above in HPI  Physical Exam: There were no vitals taken for this visit.  Constitutional:  Alert and oriented, No acute distress. HEENT: [place] AT, moist mucus  membranes.  Trachea midline, no masses. Cardiovascular: No clubbing, cyanosis, or edema. Respiratory: Normal respiratory effort, no increased work of breathing. GI: No inguinal hernias GU: Normal phallus. No masses/lesions on penis, testis, scrotum. Prostate ***g smooth no nodules no induration.  Lymph: No cervical or inguinal lymphadenopathy. Skin: No rashes, bruises or suspicious lesions. Neurologic: Grossly intact, no focal deficits, moving all 4 extremities. Psychiatric: Normal mood and affect.  Laboratory Data: Lab Results  Component Value Date   WBC 7.4 02/21/2022   HGB 14.7 02/21/2022   HCT 42.8 02/21/2022   MCV 92 02/21/2022   PLT 239 02/21/2022    Lab Results  Component Value Date   CREATININE 1.20 02/21/2022    Lab Results  Component Value Date   PSA 0.8 02/10/2020    No results found for:  TESTOSTERONE  Lab Results  Component Value Date   HGBA1C 5.8 (H) 02/21/2022    Urinalysis   Pertinent Imaging: ***  Assessment:    Plan:    There are no diagnoses linked to this encounter.  No follow-ups on file.  Garnette CHRISTELLA Shack, MD  [facility] [place]

## 2024-07-15 ENCOUNTER — Ambulatory Visit (INDEPENDENT_AMBULATORY_CARE_PROVIDER_SITE_OTHER): Payer: MEDICARE | Admitting: Urology

## 2024-07-15 ENCOUNTER — Encounter: Payer: Self-pay | Admitting: Urology

## 2024-07-15 ENCOUNTER — Other Ambulatory Visit: Payer: Self-pay | Admitting: Family Medicine

## 2024-07-15 ENCOUNTER — Telehealth: Payer: Self-pay

## 2024-07-15 VITALS — BP 127/85 | HR 88

## 2024-07-15 DIAGNOSIS — R3129 Other microscopic hematuria: Secondary | ICD-10-CM

## 2024-07-15 DIAGNOSIS — Z87442 Personal history of urinary calculi: Secondary | ICD-10-CM

## 2024-07-15 DIAGNOSIS — N200 Calculus of kidney: Secondary | ICD-10-CM

## 2024-07-15 LAB — URINALYSIS, ROUTINE W REFLEX MICROSCOPIC
Bilirubin, UA: NEGATIVE
Glucose, UA: NEGATIVE
Ketones, UA: NEGATIVE
Nitrite, UA: NEGATIVE
Specific Gravity, UA: 1.025 (ref 1.005–1.030)
Urobilinogen, Ur: 1 mg/dL (ref 0.2–1.0)
pH, UA: 6 (ref 5.0–7.5)

## 2024-07-15 LAB — MICROSCOPIC EXAMINATION
Bacteria, UA: NONE SEEN
RBC, Urine: >30 /HPF — AB (ref 0–2)

## 2024-07-15 MED ORDER — PREDNISONE 10 MG PO TABS: ORAL_TABLET | ORAL | 0 refills | Status: DC

## 2024-07-15 NOTE — Telephone Encounter (Signed)
 Tech called to get Dx codes and and test type for pt. Tech requested to send a completed form to fax number [phone]

## 2024-07-18 ENCOUNTER — Other Ambulatory Visit: Payer: Self-pay | Admitting: Family Medicine

## 2024-07-20 ENCOUNTER — Other Ambulatory Visit: Payer: Self-pay | Admitting: Family Medicine

## 2024-07-21 ENCOUNTER — Other Ambulatory Visit: Payer: Self-pay | Admitting: Urology

## 2024-07-22 ENCOUNTER — Ambulatory Visit (HOSPITAL_COMMUNITY)
Admission: RE | Admit: 2024-07-22 | Discharge: 2024-07-22 | Disposition: A | Discharge status: Home / Self Care | Payer: MEDICARE | Source: Ambulatory Visit | Attending: Urology | Admitting: Urology

## 2024-07-22 DIAGNOSIS — N200 Calculus of kidney: Secondary | ICD-10-CM | POA: Diagnosis present

## 2024-07-23 ENCOUNTER — Other Ambulatory Visit: Payer: Self-pay | Admitting: Family Medicine

## 2024-07-23 LAB — STONE ANALYSIS
Calcium Oxalate Monohydrate: 10 %
Uric Acid Calculi: 90 %
Weight Calculi: 12 mg

## 2024-07-28 ENCOUNTER — Ambulatory Visit: Payer: Self-pay | Admitting: Urology

## 2024-07-28 NOTE — Telephone Encounter (Signed)
 FYI and advise will schedule pt for next available. Please let me know if it needs to be sooner

## 2024-07-30 LAB — LITHOLINK 24HR URINE PANEL
Ammonium, Urine: 57 mmol/(24.h) (ref 15–60)
Calcium Oxalate Saturation: 10.65 — ABNORMAL HIGH (ref 6.00–10.00)
Calcium Phosphate Saturation: 1.07 (ref 0.50–2.00)
Calcium, Urine: 343 mg/(24.h) — ABNORMAL HIGH (ref <250)
Calcium/Creatinine Ratio: 188 mg/g{creat} (ref 34–196)
Calcium/Kg Body Weight: 4.3 mg/kg/d — ABNORMAL HIGH (ref <4.0)
Chloride, Urine: 128 mmol/(24.h) (ref 70–250)
Citrate, Urine: 304 mg/(24.h) — ABNORMAL LOW (ref >450)
Creatinine, Urine: 1823 mg/(24.h)
Creatinine/Kg Body Weight: 23 mg/kg/d (ref 11.9–24.4)
Cystine, Urine, Qualitative: NEGATIVE
Magnesium, Urine: 103 mg/(24.h) (ref 30–120)
Oxalate, Urine: 33 mg/(24.h) (ref 20–40)
Phosphorus, Urine: 1222 mg/(24.h) — ABNORMAL HIGH (ref 600–1200)
Potassium, Urine: 37 mmol/(24.h) (ref 20–100)
Protein Catabolic Rate: 1.3 g/kg/d (ref 0.8–1.4)
Sodium, Urine: 146 mmol/(24.h) (ref 50–150)
Sulfate, Urine: 60 meq/(24.h) (ref 20–80)
Urea Nitrogen, Urine: 14.7 g/(24.h) — ABNORMAL HIGH (ref 6.00–14.00)
Uric Acid Saturation: 2.79 — ABNORMAL HIGH (ref <1.00)
Uric Acid, Urine: 946 mg/(24.h) — ABNORMAL HIGH (ref <800)
Urine Volume (Preserved): 1470 mL/(24.h) (ref 500–4000)
pH, 24 hr, Urine: 5.532 — ABNORMAL LOW (ref 5.800–6.200)

## 2024-07-30 LAB — SPECIMEN STATUS REPORT

## 2024-08-04 ENCOUNTER — Ambulatory Visit: Payer: MEDICARE | Admitting: Urology

## 2024-08-05 ENCOUNTER — Other Ambulatory Visit: Payer: Self-pay | Admitting: Urology

## 2024-08-05 DIAGNOSIS — N200 Calculus of kidney: Secondary | ICD-10-CM

## 2024-08-05 MED ORDER — POTASSIUM CITRATE ER 15 MEQ (1620 MG) PO TBCR: EXTENDED_RELEASE_TABLET | ORAL | 11 refills | Status: AC

## 2024-08-05 NOTE — Telephone Encounter (Signed)
 Please send in Rx for pt or let me know what you'd like to send with dosage instruction, quantity and refill amount

## 2024-08-06 ENCOUNTER — Other Ambulatory Visit: Payer: Self-pay

## 2024-08-06 DIAGNOSIS — N200 Calculus of kidney: Secondary | ICD-10-CM

## 2024-08-11 NOTE — Progress Notes (Incomplete)
 Assessment:  History of recurrent urolithiasis, no significant stone or metabolic evaluation available  Microscopic hematuria, quite possibly due to a stone, but needs evaluation   Plan:      HPI: 68 yo male here for E/M of kidney stones.  The patient states that he has passed multiple stones over the past 10 or 20 years.  Typically, they come from the left side, and he is not terribly symptomatic when he passes these.  He brings in a vial today with 20-30 small stones.  He states that he has passed these over the past year or 2.  He has had 1 intervention, it sounds like shockwave lithotripsy years ago for a stone.  Otherwise, he has not had any evaluation or long-term management of stones.  Currently, he is relatively asymptomatic.  No family history of stones.  No personal or family history of gout.  He has not had his stones analyzed before.  10.7.2025:  PMH: Past Medical History:  Diagnosis Date   Allergy    seasonal   Anxiety    Asthma    Back pain    COPD (chronic obstructive pulmonary disease) (HCC)    Essential hypertension, benign 03/17/2020   GERD (gastroesophageal reflux disease) 1980's   HLD (hyperlipidemia) 03/17/2020   Prediabetes 03/17/2020   Shoulder pain    Ulcer 1970    Surgical History: Past Surgical History:  Procedure Laterality Date   EYE SURGERY     TONSILECTOMY/ADENOIDECTOMY WITH MYRINGOTOMY      Home Medications:  Allergies as of 08/12/2024       Reactions   Penicillins Shortness Of Breath, Swelling   Sulfa Antibiotics Hives, Itching        Medication List        Accurate as of August 11, 2024  7:44 PM. If you have any questions, ask your nurse or doctor.          ALPRAZolam  0.5 MG tablet Commonly known as: XANAX  Take 1 tablet (0.5 mg total) by mouth 2 (two) times daily as needed for anxiety.   atorvastatin  10 MG tablet Commonly known as: LIPITOR TAKE 1 TABLET BY MOUTH EVERY DAY   B COMPLEX PO Take by mouth.    citalopram  20 MG tablet Commonly known as: CELEXA  TAKE 1 TABLET BY MOUTH EVERY DAY   Dulera  100-5 MCG/ACT Aero Generic drug: mometasone-formoterol  Inhale 2 puffs into the lungs in the morning and at bedtime.   Excedrin Migraine 250-250-65 MG tablet Generic drug: aspirin-acetaminophen-caffeine Take 2 tablets by mouth every 6 (six) hours as needed for headache.   lisinopril  20 MG tablet Commonly known as: ZESTRIL  TAKE 1 TABLET BY MOUTH EVERY DAY   loratadine 10 MG tablet Commonly known as: CLARITIN Take 10 mg by mouth daily as needed.   meloxicam  15 MG tablet Commonly known as: MOBIC  TAKE 1 TABLET BY MOUTH EVERY DAY AS NEEDED FOR PAIN   metoprolol  succinate 25 MG 24 hr tablet Commonly known as: TOPROL -XL Take 0.5 tablets (12.5 mg total) by mouth daily.   MULTIVITAMIN ADULT PO Take by mouth.   Potassium Citrate 15 MEQ (1620 MG) Tbcr 1 tablet p.o. before every meal and at bedtime   predniSONE  10 MG tablet Commonly known as: DELTASONE  50 mg daily x 2 days, then 40 mg daily x 2 days, then 30 mg daily x 2 days, then 20 mg daily x 2 days, then 10 mg daily x 2 days.   PRILOSEC OTC PO Take by mouth.  Vitamin D-3 125 MCG (5000 UT) Tabs Take 2 tablets by mouth daily.   vitamin k 100 MCG tablet Take 100 mcg by mouth daily.        Allergies:  Allergies  Allergen Reactions   Penicillins Shortness Of Breath and Swelling   Sulfa Antibiotics Hives and Itching    Family History: Family History  Problem Relation Age of Onset   Memory loss Mother    COPD Mother    COPD Father    COPD Son    COPD Sister    COPD Brother    COPD Daughter     Social History:  reports that he has never smoked. He has never used smokeless tobacco. He reports current alcohol use of about 2.0 standard drinks of alcohol per week. He reports that he does not use drugs.  ROS: All other review of systems were reviewed and are negative except what is noted above in HPI  Physical  Exam: There were no vitals taken for this visit.  Constitutional:  Alert and oriented, No acute distress. HEENT: [place] AT, moist mucus membranes.  Trachea midline, no masses. Cardiovascular: No clubbing, cyanosis, or edema. Respiratory: Normal respiratory effort, no increased work of breathing. Lymph: No cervical or inguinal lymphadenopathy. Skin: No rashes, bruises or suspicious lesions. Neurologic: Grossly intact, no focal deficits, moving all 4 extremities. Psychiatric: Normal mood and affect.  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   No evidence of ureteral calculi, hydronephrosis, or other acute findings.   Cholelithiasis. No radiographic evidence of cholecystitis.

## 2024-08-12 ENCOUNTER — Ambulatory Visit: Payer: MEDICARE | Admitting: Urology

## 2024-08-12 DIAGNOSIS — N200 Calculus of kidney: Secondary | ICD-10-CM

## 2024-08-19 ENCOUNTER — Other Ambulatory Visit: Payer: Self-pay | Admitting: Family Medicine

## 2024-09-03 ENCOUNTER — Ambulatory Visit: Payer: Self-pay

## 2024-09-03 NOTE — Telephone Encounter (Signed)
 FYI Only or Action Required?: FYI only for provider: appointment scheduled on 09/04/24.  Patient was last seen in primary care on 07/09/2024 by Cook, Jayce G, DO.  Called Nurse Triage reporting Sinusitis.  Symptoms began several weeks ago.  Interventions attempted: OTC medications: afrin.  Symptoms are: unchanged.  Triage Disposition: See PCP When Office is Open (Within 3 Days)  Patient/caregiver understands and will follow disposition?: Yes    Copied from CRM #[id number]. Topic: Clinical - Red Word Triage >> Sep 03, 2024  2:16 PM Sophia H wrote: Red Word that prompted transfer to Nurse Triage:  Dizziness/vertigo, Left side ear feels full, sinus issues. Reason for Disposition  [1] MILD dizziness (e.g., vertigo; walking normally) AND [2] has NOT been evaluated by doctor (or NP/PA) for this  [1] Sinus congestion (pressure, fullness) AND [2] present > 10 days  Answer Assessment - Initial Assessment Questions 1. LOCATION: Where does it hurt?      Left side, ear 2. ONSET: When did the sinus pain start?  (e.g., hours, days)      For some time 3. SEVERITY: How bad is the pain?   (Scale 0-10; or none, mild, moderate or severe)     No pain  5. NASAL CONGESTION: Is the nose blocked? If Yes, ask: Can you open it or must you breathe through your mouth?     yes 6. NASAL DISCHARGE: Do you have discharge from your nose? If so ask, What color?     no 7. FEVER: Do you have a fever? If Yes, ask: What is it, how was it measured, and when did it start?      no 8. OTHER SYMPTOMS: Do you have any other symptoms? (e.g., sore throat, cough, earache, difficulty breathing)     Scratchy throat, nasal congestion, sinuses on left side feel full and left ear fullness.  Answer Assessment - Initial Assessment Questions Pt states he is having dizziness and left sinus issues, feels full, ear feels full, congestion. He denies any fever. States he has had a bit of a scratchy throat but not  sore. He is using afrin to help clear things up. Moving his head makes it worse.   1. DESCRIPTION: Describe your dizziness.     Vertigo/dizziness 2. VERTIGO: Do you feel like either you or the room is spinning or tilting?      spinning 3. LIGHTHEADED: Do you feel lightheaded? (e.g., somewhat faint, woozy, weak upon standing)     no 4. SEVERITY: How bad is it?  Can you walk?     yes 5. ONSET:  When did the dizziness begin?     A while 6. AGGRAVATING FACTORS: Does anything make it worse? (e.g., standing, change in head position)     Moving head  7. CAUSE: What do you think is causing the dizziness?     Sinuses or ear  9. OTHER SYMPTOMS: Do you have any other symptoms? (e.g., earache, headache, numbness, tinnitus, vomiting, weakness)     Earfullness  No fever, no sore throat, sinus left side, afrin to keep nose clear, cough not anything up  Protocols used: Sinus Pain or Congestion-A-AH, Dizziness - Vertigo-A-AH

## 2024-09-04 ENCOUNTER — Encounter: Payer: Self-pay | Admitting: Family Medicine

## 2024-09-04 ENCOUNTER — Ambulatory Visit (INDEPENDENT_AMBULATORY_CARE_PROVIDER_SITE_OTHER): Payer: MEDICARE | Admitting: Family Medicine

## 2024-09-04 VITALS — BP 103/71 | HR 94 | Ht 67.0 in | Wt 181.0 lb

## 2024-09-04 DIAGNOSIS — J019 Acute sinusitis, unspecified: Secondary | ICD-10-CM

## 2024-09-04 DIAGNOSIS — J329 Chronic sinusitis, unspecified: Secondary | ICD-10-CM | POA: Insufficient documentation

## 2024-09-04 MED ORDER — PREDNISONE 10 MG PO TABS: ORAL_TABLET | ORAL | 0 refills | Status: DC

## 2024-09-04 MED ORDER — LEVOFLOXACIN 500 MG PO TABS: 500.0000 mg | ORAL_TABLET | Freq: Every day | ORAL | 0 refills | Status: AC

## 2024-09-04 NOTE — Assessment & Plan Note (Signed)
 Treating with Levaquin and Prednisone .

## 2024-09-04 NOTE — Progress Notes (Signed)
 Subjective:  Patient ID: Martin Rodriguez, male    DOB: Dec 26, 1955  Age: 68 y.o. MRN: [id number]  CC:   Chief Complaint  Patient presents with   Sinusitis    Left sided pressure in face, ears and eye , dizzy off and on     HPI:  68 year old male presents for evaluation of the above.  2-3 weeks of symptoms.  Reports sinus pressure and congestion. Ear pressure as well. No fever. No relieving factors. No other complaints at this time.  Patient Active Problem List   Diagnosis Date Noted   Sinusitis 09/04/2024   Bronchitis 07/09/2024   Palpitations 12/19/2023   Chronic cough 08/09/2023   Kidney stone 05/29/2023   OSA (obstructive sleep apnea) 05/05/2022   Chronic left shoulder pain 02/21/2022   Essential hypertension, benign 03/17/2020   Anxiety 03/17/2020   HLD (hyperlipidemia) 03/17/2020    Social Hx   Social History   Socioeconomic History   Marital status: Divorced    Spouse name: Not on file   Number of children: Not on file   Years of education: Not on file   Highest education level: Associate degree: occupational, scientist, product/process development, or vocational program  Occupational History   Occupation: retired    Comment: [employer]  Tobacco Use   Smoking status: Never   Smokeless tobacco: Never  Vaping Use   Vaping status: Not on file  Substance and Sexual Activity   Alcohol use: Yes    Alcohol/week: 2.0 standard drinks of alcohol    Types: 1 Glasses of wine, 1 Cans of beer per week    Comment: occasional   Drug use: Never   Sexual activity: Not Currently    Birth control/protection: None  Other Topics Concern   Not on file  Social History Narrative   Single,divorced.Lives with ex-wife.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   Social Drivers of Corporate Investment Banker Strain: Low Risk  (09/03/2024)   Overall Financial Resource Strain (CARDIA)    Difficulty of Paying Living Expenses: Not hard at all  Food Insecurity: No Food  Insecurity (09/03/2024)   Hunger Vital Sign    Worried About Running Out of Food in the Last Year: Never true    Ran Out of Food in the Last Year: Never true  Transportation Needs: No Transportation Needs (09/03/2024)   PRAPARE - Administrator, Civil Service (Medical): No    Lack of Transportation (Non-Medical): No  Physical Activity: Sufficiently Active (09/03/2024)   Exercise Vital Sign    Days of Exercise per Week: 3 days    Minutes of Exercise per Session: 50 min  Recent Concern: Physical Activity - Insufficiently Active (06/17/2024)   Exercise Vital Sign    Days of Exercise per Week: 2 days    Minutes of Exercise per Session: 60 min  Stress: No Stress Concern Present (09/03/2024)   [employer] of Occupational Health - Occupational Stress Questionnaire    Feeling of Stress: Not at all  Social Connections: Socially Isolated (09/03/2024)   Social Connection and Isolation Panel    Frequency of Communication with Friends and Family: Three times a week    Frequency of Social Gatherings with Friends and Family: Three times a week    Attends Religious Services: Never    Active Member of Clubs or Organizations: No    Attends Engineer, Structural: Not on file    Marital Status: Divorced    Review of Systems Per  HPI  Objective:  BP 103/71   Pulse 94   Ht 5' 7 (1.702 m)   Wt 181 lb (82.1 kg)   SpO2 98%   BMI 28.35 kg/m      09/04/2024    3:01 PM 07/15/2024    9:17 AM 07/09/2024   11:14 AM  BP/Weight  Systolic BP 103 127 97  Diastolic BP 71 85 65  Wt. (Lbs) 181  182  BMI 28.35 kg/m2  28.51 kg/m2    Physical Exam Vitals and nursing note reviewed.  Constitutional:      General: He is not in acute distress.    Appearance: Normal appearance.  HENT:     Head: Normocephalic and atraumatic.     Right Ear: Tympanic membrane normal.     Left Ear: Tympanic membrane normal.     Nose: Congestion present.  Cardiovascular:     Rate and Rhythm: Normal  rate and regular rhythm.  Pulmonary:     Effort: Pulmonary effort is normal.     Breath sounds: Normal breath sounds.  Neurological:     Mental Status: He is alert.     Lab Results  Component Value Date   WBC 7.4 02/21/2022   HGB 14.7 02/21/2022   HCT 42.8 02/21/2022   PLT 239 02/21/2022   GLUCOSE 107 (H) 02/21/2022   CHOL 147 02/21/2022   TRIG 166 (H) 02/21/2022   HDL 46 02/21/2022   LDLCALC 73 02/21/2022   ALT 25 02/21/2022   AST 23 02/21/2022   NA 141 02/21/2022   K 4.1 02/21/2022   CL 102 02/21/2022   CREATININE 1.20 02/21/2022   BUN 21 02/21/2022   CO2 24 02/21/2022   TSH 3.57 02/10/2020   PSA 0.8 02/10/2020   HGBA1C 5.8 (H) 02/21/2022     Assessment & Plan:  Acute sinusitis, recurrence not specified, unspecified location Assessment & Plan: Treating with Levaquin and Prednisone .   Other orders -     levoFLOXacin; Take 1 tablet (500 mg total) by mouth daily for 7 days.  Dispense: 7 tablet; Refill: 0 -     predniSONE ; 50 mg daily x 2 days, then 40 mg daily x 2 days, then 30 mg daily x 2 days, then 20 mg daily x 2 days, then 10 mg daily x 2 days.  Dispense: 30 tablet; Refill: 0    Follow-up:  Return if symptoms worsen or fail to improve.  Jacqulyn Ahle DO [facility]

## 2024-09-20 ENCOUNTER — Other Ambulatory Visit: Payer: Self-pay | Admitting: Family Medicine

## 2024-10-07 ENCOUNTER — Encounter: Payer: Self-pay | Admitting: Family Medicine

## 2024-10-07 ENCOUNTER — Other Ambulatory Visit: Payer: Self-pay | Admitting: Family Medicine

## 2024-10-07 MED ORDER — PREDNISONE 10 MG (21) PO TBPK: ORAL_TABLET | ORAL | 0 refills | Status: AC

## 2024-10-09 ENCOUNTER — Ambulatory Visit: Payer: MEDICARE | Admitting: Family Medicine

## 2024-10-09 VITALS — BP 128/79 | Temp 98.1°F | Ht 67.0 in | Wt 184.0 lb

## 2024-10-09 DIAGNOSIS — J42 Unspecified chronic bronchitis: Secondary | ICD-10-CM

## 2024-10-09 MED ORDER — AZITHROMYCIN 250 MG PO TABS: ORAL_TABLET | ORAL | 0 refills | Status: AC

## 2024-10-12 DIAGNOSIS — J42 Unspecified chronic bronchitis: Secondary | ICD-10-CM | POA: Insufficient documentation

## 2024-10-12 NOTE — Progress Notes (Signed)
 Subjective:  Patient ID: Martin Rodriguez, male    DOB: 1956-01-10  Age: 68 y.o. MRN: [id number]  CC:   Chief Complaint  Patient presents with   Cough    congestion    HPI:  68 year old male with recurrent sinusitis/bronchitis presents for evaluation of the above.  Symptoms over the past few days. Has had a sick contact. He endorses recent fever. Declines COVID/Flu/RSV testing. Current afebrile. No relieving factors.   Patient Active Problem List   Diagnosis Date Noted   Chronic bronchitis (HCC) 10/12/2024   Sinusitis 09/04/2024   Bronchitis 07/09/2024   Palpitations 12/19/2023   Chronic cough 08/09/2023   Kidney stone 05/29/2023   OSA (obstructive sleep apnea) 05/05/2022   Chronic left shoulder pain 02/21/2022   Essential hypertension, benign 03/17/2020   Anxiety 03/17/2020   HLD (hyperlipidemia) 03/17/2020    Social Hx   Social History   Socioeconomic History   Marital status: Divorced    Spouse name: Not on file   Number of children: Not on file   Years of education: Not on file   Highest education level: Associate degree: occupational, scientist, product/process development, or vocational program  Occupational History   Occupation: retired    Comment: [employer]  Tobacco Use   Smoking status: Never   Smokeless tobacco: Never  Vaping Use   Vaping status: Not on file  Substance and Sexual Activity   Alcohol use: Yes    Alcohol/week: 2.0 standard drinks of alcohol    Types: 1 Glasses of wine, 1 Cans of beer per week    Comment: occasional   Drug use: Never   Sexual activity: Not Currently    Birth control/protection: None  Other Topics Concern   Not on file  Social History Narrative   Single,divorced.Lives with ex-wife.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   Social Drivers of Corporate Investment Banker Strain: Low Risk  (09/03/2024)   Overall Financial Resource Strain (CARDIA)    Difficulty of Paying Living Expenses: Not hard at all  Food  Insecurity: No Food Insecurity (09/03/2024)   Hunger Vital Sign    Worried About Running Out of Food in the Last Year: Never true    Ran Out of Food in the Last Year: Never true  Transportation Needs: No Transportation Needs (09/03/2024)   PRAPARE - Administrator, Civil Service (Medical): No    Lack of Transportation (Non-Medical): No  Physical Activity: Sufficiently Active (09/03/2024)   Exercise Vital Sign    Days of Exercise per Week: 3 days    Minutes of Exercise per Session: 50 min  Recent Concern: Physical Activity - Insufficiently Active (06/17/2024)   Exercise Vital Sign    Days of Exercise per Week: 2 days    Minutes of Exercise per Session: 60 min  Stress: No Stress Concern Present (09/03/2024)   [employer] of Occupational Health - Occupational Stress Questionnaire    Feeling of Stress: Not at all  Social Connections: Socially Isolated (09/03/2024)   Social Connection and Isolation Panel    Frequency of Communication with Friends and Family: Three times a week    Frequency of Social Gatherings with Friends and Family: Three times a week    Attends Religious Services: Never    Active Member of Clubs or Organizations: No    Attends Engineer, Structural: Not on file    Marital Status: Divorced    Review of Systems Per HPI  Objective:  BP  128/79   Temp 98.1 F (36.7 C) (Oral)   Ht 5' 7 (1.702 m)   Wt 184 lb (83.5 kg)   BMI 28.82 kg/m      10/09/2024    2:05 PM 09/04/2024    3:01 PM 07/15/2024    9:17 AM  BP/Weight  Systolic BP 128 103 127  Diastolic BP 79 71 85  Wt. (Lbs) 184 181   BMI 28.82 kg/m2 28.35 kg/m2     Physical Exam Vitals and nursing note reviewed.  Constitutional:      General: He is not in acute distress. HENT:     Head: Normocephalic and atraumatic.  Cardiovascular:     Rate and Rhythm: Normal rate and regular rhythm.  Pulmonary:     Effort: Pulmonary effort is normal.     Breath sounds: Normal breath sounds.  No wheezing or rales.  Neurological:     Mental Status: He is alert.     Lab Results  Component Value Date   WBC 7.4 02/21/2022   HGB 14.7 02/21/2022   HCT 42.8 02/21/2022   PLT 239 02/21/2022   GLUCOSE 107 (H) 02/21/2022   CHOL 147 02/21/2022   TRIG 166 (H) 02/21/2022   HDL 46 02/21/2022   LDLCALC 73 02/21/2022   ALT 25 02/21/2022   AST 23 02/21/2022   NA 141 02/21/2022   K 4.1 02/21/2022   CL 102 02/21/2022   CREATININE 1.20 02/21/2022   BUN 21 02/21/2022   CO2 24 02/21/2022   TSH 3.57 02/10/2020   PSA 0.8 02/10/2020   HGBA1C 5.8 (H) 02/21/2022     Assessment & Plan:  Chronic bronchitis, unspecified chronic bronchitis type (HCC) Assessment & Plan: Treating with Azithromycin .  Discussed referral and/or CT imaging in the future.   Other orders -     Azithromycin ; Take 2 tablets on day 1, then 1 tablet daily on days 2 through 5  Dispense: 6 tablet; Refill: 0    Cristobal Advani DO [facility]

## 2024-10-12 NOTE — Assessment & Plan Note (Signed)
 Treating with Azithromycin .  Discussed referral and/or CT imaging in the future.

## 2024-10-16 ENCOUNTER — Other Ambulatory Visit: Payer: Self-pay | Admitting: Family Medicine

## 2024-10-21 ENCOUNTER — Other Ambulatory Visit: Payer: Self-pay | Admitting: Family Medicine

## 2024-11-03 ENCOUNTER — Other Ambulatory Visit: Payer: Self-pay | Admitting: Family Medicine

## 2024-11-07 ENCOUNTER — Ambulatory Visit (HOSPITAL_COMMUNITY)
Admission: RE | Admit: 2024-11-07 | Discharge: 2024-11-07 | Disposition: A | Discharge status: Home / Self Care | Payer: MEDICARE | Source: Ambulatory Visit | Attending: Urology | Admitting: Urology

## 2024-11-07 DIAGNOSIS — N200 Calculus of kidney: Secondary | ICD-10-CM | POA: Diagnosis present

## 2024-11-10 NOTE — Progress Notes (Unsigned)
 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  Plan:   I discussed possible treatment of the large staghorn with the patient-ureteroscopic management versus percutaneous.  I will contact Dr. Sherrilee about which he would prefer, and we will set up an appointment for preoperative discussion    HPI: 9.9.2025: 69 yo male here for E/M of kidney stones.  The patient states that he has passed multiple stones over the past 10 or 20 years.  Typically, they come from the left side, and he is not terribly symptomatic when he passes these.  He brings in a vial today with 20-30 small stones.  He states that he has passed these over the past year or 2.  He has had 1 intervention, it sounds like shockwave lithotripsy years ago for a stone.  Otherwise, he has not had any evaluation or long-term management of stones.  Currently, he is relatively asymptomatic.  No family history of stones.  No personal or family history of gout.  He has not had his stones analyzed before.  Following his visit, he underwent stone protocol CT.  This revealed bilateral nephrolithiasis with cysts with the partial staghorn in the right kidney.  This measured 25.96 mm.  Dec 03, 2024: He has passed a couple of small left kidney stones since his first visit, otherwise has been asymptomatic  PMH: Past Medical History:  Diagnosis Date   Allergy    seasonal   Anxiety    Asthma    Back pain    COPD (chronic obstructive pulmonary disease) (HCC)    Essential hypertension, benign 03/17/2020   GERD (gastroesophageal reflux disease) 1980s   HLD (hyperlipidemia) 03/17/2020   Prediabetes 03/17/2020   Shoulder pain    Ulcer 1970    Surgical History: Past Surgical History:  Procedure Laterality Date   EYE SURGERY     TONSILECTOMY/ADENOIDECTOMY WITH MYRINGOTOMY      Home Medications:  Allergies as of 2024-12-03       Reactions   Penicillins Shortness Of Breath, Swelling   Sulfa  Antibiotics Hives, Itching        Medication List        Accurate as of November 10, 2024  3:00 PM. If you have any questions, ask your nurse or doctor.          ALPRAZolam  0.5 MG tablet Commonly known as: XANAX  TAKE 1 TABLET BY MOUTH 2 TIMES DAILY AS NEEDED FOR ANXIETY.   atorvastatin  10 MG tablet Commonly known as: LIPITOR TAKE 1 TABLET BY MOUTH EVERY DAY   B COMPLEX PO Take by mouth.   citalopram  20 MG tablet Commonly known as: CELEXA  TAKE 1 TABLET BY MOUTH EVERY DAY   Dulera  100-5 MCG/ACT Aero Generic drug: mometasone-formoterol  Inhale 2 puffs into the lungs in the morning and at bedtime.   Excedrin Migraine 250-250-65 MG tablet Generic drug: aspirin-acetaminophen-caffeine Take 2 tablets by mouth every 6 (six) hours as needed for headache.   lisinopril  20 MG tablet Commonly known as: ZESTRIL  TAKE 1 TABLET BY MOUTH EVERY DAY   loratadine 10 MG tablet Commonly known as: CLARITIN Take 10 mg by mouth daily as needed.   meloxicam  15 MG tablet Commonly known as: MOBIC  TAKE 1 TABLET BY MOUTH EVERY DAY AS NEEDED FOR PAIN   metoprolol  succinate 25 MG 24 hr tablet Commonly known as: TOPROL -XL Take 0.5 tablets (12.5 mg total) by mouth daily.   MULTIVITAMIN ADULT PO Take by mouth.  Potassium Citrate  15 MEQ (1620 MG) Tbcr 1 tablet p.o. before every meal and at bedtime   predniSONE  10 MG (21) Tbpk tablet Commonly known as: STERAPRED UNI-PAK 21 TAB 6 tablets on day 1; decrease by 1 tablet daily until gone.   PRILOSEC OTC PO Take by mouth.   Vitamin D-3 125 MCG (5000 UT) Tabs Take 2 tablets by mouth daily.   vitamin k 100 MCG tablet Take 100 mcg by mouth daily.        Allergies:  Allergies  Allergen Reactions   Penicillins Shortness Of Breath and Swelling   Sulfa Antibiotics Hives and Itching    Family History: Family History  Problem Relation Age of Onset   Memory loss Mother    COPD Mother    COPD Father    COPD Son    COPD Sister     COPD Brother    COPD Daughter     Social History:  reports that he has never smoked. He has never used smokeless tobacco. He reports current alcohol use of about 2.0 standard drinks of alcohol per week. He reports that he does not use drugs.  ROS: All other review of systems were reviewed and are negative except what is noted above in HPI  Physical Exam: There were no vitals taken for this visit.  Constitutional:  Alert and oriented, No acute distress. HEENT: [place] AT, moist mucus membranes.  Trachea midline, no masses. Cardiovascular: No clubbing, cyanosis, or edema. Respiratory: Normal respiratory effort, no increased work of breathing. Lymph: No cervical or inguinal lymphadenopathy. Skin: No rashes, bruises or suspicious lesions. Neurologic: Grossly intact, no focal deficits, moving all 4 extremities. Psychiatric: Normal mood and affect.  Laboratory Data: Lab Results  Component Value Date   WBC 7.4 02/21/2022   HGB 14.7 02/21/2022   HCT 42.8 02/21/2022   MCV 92 02/21/2022   PLT 239 02/21/2022    Lab Results  Component Value Date   CREATININE 1.20 02/21/2022    Lab Results  Component Value Date   PSA 0.8 02/10/2020    No results found for: TESTOSTERONE  Lab Results  Component Value Date   HGBA1C 5.8 (H) 02/21/2022    Urinalysis today reveals microscopic hematuria  Most recent CMP reviewed-past 2 serum calcium  levels were 9.3 and 9.8  CT images reviewed with the patient    "

## 2024-11-11 ENCOUNTER — Ambulatory Visit: Payer: MEDICARE | Admitting: Urology

## 2024-11-11 VITALS — BP 102/69 | HR 94

## 2024-11-11 DIAGNOSIS — N200 Calculus of kidney: Secondary | ICD-10-CM

## 2024-11-11 LAB — URINALYSIS, ROUTINE W REFLEX MICROSCOPIC
Bilirubin, UA: NEGATIVE
Glucose, UA: NEGATIVE
Ketones, UA: NEGATIVE
Nitrite, UA: NEGATIVE
Specific Gravity, UA: 1.03 (ref 1.005–1.030)
Urobilinogen, Ur: 0.2 mg/dL (ref 0.2–1.0)
pH, UA: 6 (ref 5.0–7.5)

## 2024-11-11 LAB — MICROSCOPIC EXAMINATION: Bacteria, UA: NONE SEEN

## 2024-11-16 ENCOUNTER — Other Ambulatory Visit: Payer: Self-pay | Admitting: Family Medicine

## 2024-11-17 ENCOUNTER — Other Ambulatory Visit: Payer: Self-pay | Admitting: Urology

## 2024-11-17 ENCOUNTER — Ambulatory Visit: Payer: Self-pay | Admitting: Urology

## 2024-11-17 DIAGNOSIS — N200 Calculus of kidney: Secondary | ICD-10-CM

## 2024-11-17 NOTE — Telephone Encounter (Signed)
 Good Morning Dr. JONETTA I got the verbal from Dr. Sherrilee that he would like to proceed with the percutaneous. Let me know if you need anything further

## 2024-12-02 ENCOUNTER — Ambulatory Visit: Payer: MEDICARE | Admitting: Family Medicine

## 2024-12-09 ENCOUNTER — Ambulatory Visit: Payer: MEDICARE | Admitting: Urology

## 2024-12-19 ENCOUNTER — Ambulatory Visit: Payer: MEDICARE

## 2024-12-24 ENCOUNTER — Ambulatory Visit: Payer: MEDICARE | Admitting: Urology
